# Patient Record
Sex: Male | Born: 1970 | Race: Black or African American | Hispanic: No | Smoking: Former smoker
Health system: Southern US, Community
[De-identification: ages and names within clinical notes are randomized; demographics above are authoritative.]

## PROBLEM LIST (undated history)

## (undated) DIAGNOSIS — I10 Essential (primary) hypertension: Secondary | ICD-10-CM

## (undated) DIAGNOSIS — M48062 Spinal stenosis, lumbar region with neurogenic claudication: Secondary | ICD-10-CM

## (undated) DIAGNOSIS — M1990 Unspecified osteoarthritis, unspecified site: Secondary | ICD-10-CM

## (undated) DIAGNOSIS — F329 Major depressive disorder, single episode, unspecified: Secondary | ICD-10-CM

## (undated) DIAGNOSIS — F32A Depression, unspecified: Secondary | ICD-10-CM

## (undated) DIAGNOSIS — F419 Anxiety disorder, unspecified: Secondary | ICD-10-CM

## (undated) HISTORY — DX: Anxiety disorder, unspecified: F41.9

## (undated) HISTORY — DX: Major depressive disorder, single episode, unspecified: F32.9

## (undated) HISTORY — DX: Depression, unspecified: F32.A

## (undated) HISTORY — DX: Unspecified osteoarthritis, unspecified site: M19.90

## (undated) HISTORY — DX: Essential (primary) hypertension: I10

---

## 2014-07-09 ENCOUNTER — Telehealth: Payer: Self-pay | Admitting: Family Medicine

## 2014-07-09 NOTE — Telephone Encounter (Signed)
Patient was transferred to Billing to discuss the cost of the visit and then appt will be scheduled.

## 2014-07-13 ENCOUNTER — Telehealth: Payer: Self-pay | Admitting: Family Medicine

## 2014-07-13 NOTE — Telephone Encounter (Signed)
appt given for Friday sept 11 with christy

## 2014-07-23 ENCOUNTER — Ambulatory Visit: Payer: Self-pay | Admitting: Family

## 2014-08-13 ENCOUNTER — Ambulatory Visit: Payer: Self-pay | Admitting: Family

## 2015-04-13 ENCOUNTER — Telehealth: Payer: Self-pay | Admitting: Family

## 2015-04-13 NOTE — Telephone Encounter (Signed)
Stp's mother and advised we don't do cortisone shots in the back and that pt is not established here as he has no-showed twice. He would need to schedule a new pt appt to establish care and then the provider could do a referral for him if they feel it is needed once he is evaluated. Pt's mother voiced understanding and states she will talk to pt and CB if he would like to schedule a new pt appt.

## 2015-11-13 HISTORY — PX: SPINE SURGERY: SHX786

## 2015-11-13 HISTORY — PX: CERVICAL SPINE SURGERY: SHX589

## 2017-09-10 DIAGNOSIS — M7741 Metatarsalgia, right foot: Secondary | ICD-10-CM | POA: Diagnosis not present

## 2017-09-10 DIAGNOSIS — L859 Epidermal thickening, unspecified: Secondary | ICD-10-CM | POA: Diagnosis not present

## 2017-12-05 DIAGNOSIS — I517 Cardiomegaly: Secondary | ICD-10-CM | POA: Diagnosis not present

## 2018-05-20 DIAGNOSIS — M4712 Other spondylosis with myelopathy, cervical region: Secondary | ICD-10-CM | POA: Diagnosis not present

## 2018-05-20 DIAGNOSIS — G8929 Other chronic pain: Secondary | ICD-10-CM | POA: Diagnosis not present

## 2018-05-20 DIAGNOSIS — I10 Essential (primary) hypertension: Secondary | ICD-10-CM | POA: Diagnosis not present

## 2018-05-20 DIAGNOSIS — M545 Low back pain: Secondary | ICD-10-CM | POA: Diagnosis not present

## 2018-07-07 ENCOUNTER — Ambulatory Visit: Payer: Medicaid Other | Admitting: Family Medicine

## 2018-07-07 ENCOUNTER — Encounter: Payer: Self-pay | Admitting: Family Medicine

## 2018-07-07 VITALS — BP 134/85 | Temp 97.5°F | Ht 76.0 in | Wt 222.1 lb

## 2018-07-07 DIAGNOSIS — M5416 Radiculopathy, lumbar region: Secondary | ICD-10-CM | POA: Diagnosis not present

## 2018-07-07 DIAGNOSIS — Z0000 Encounter for general adult medical examination without abnormal findings: Secondary | ICD-10-CM | POA: Diagnosis not present

## 2018-07-07 DIAGNOSIS — Z0001 Encounter for general adult medical examination with abnormal findings: Secondary | ICD-10-CM | POA: Diagnosis not present

## 2018-07-07 MED ORDER — LISINOPRIL-HYDROCHLOROTHIAZIDE 10-12.5 MG PO TABS: 1.0000 | ORAL_TABLET | Freq: Every day | ORAL | 5 refills | Status: DC

## 2018-07-07 MED ORDER — GABAPENTIN 300 MG PO CAPS: ORAL_CAPSULE | ORAL | 3 refills | Status: DC

## 2018-07-07 MED ORDER — AMITRIPTYLINE HCL 25 MG PO TABS: 25.0000 mg | ORAL_TABLET | Freq: Every day | ORAL | 5 refills | Status: DC

## 2018-07-07 NOTE — Progress Notes (Signed)
Subjective:  Patient ID: Tyrone Nelson, male    DOB: 10-19-1971  Age: 47 y.o. MRN: [id number]  CC: New Patient (Initial Visit)   HPI Jhovani Redd presents for new patient evaluation.  He has a history of neck surgery in 2017 and states neck pain is been better but subsequently he has developed lower back arthritis pain that radiates into the right hip and right lateral thigh.  This causes the right lower extremity to be numb and weak.  It throws him off balance.  He uses a quad cane to prevent falls.  He has been told by neurosurgery that his lower back is not yet surgical.  However he is having radicular pain from the lower back into the right lateral hip and thigh.  Limiting use of the right lower extremity as well as ambulation.  Pain runs 6/10 on average day occasionally higher.  Takes amitriptyline with no significant relief.  It does help him sleep a little better.  He tried gabapentin in the past but does not think it is pushed to an optimum dose after discussion.  Continue to try to be him to get some pain relief last MRI of his lower back is remote.  He would like to see a neurosurgeon for his back. He also has a problem with high blood pressure.  He takes the lisinopril HCTZ daily denies any side effects from that.  Depression screen PHQ 2/9 07/07/2018  Decreased Interest 2  Down, Depressed, Hopeless 3  PHQ - 2 Score 5  Altered sleeping 2  Tired, decreased energy 2  Change in appetite 2  Feeling bad or failure about yourself  2  Trouble concentrating 2  Moving slowly or fidgety/restless 0  Suicidal thoughts 1  PHQ-9 Score 16    History Arek has a past medical history of Arthritis, Depression, and Hypertension.   He has a past surgical history that includes Spine surgery (2017).   His family history includes Cancer in his brother; Hypertension in his mother.He reports that he quit smoking about 3 months ago. His smoking use included cigarettes. He has a 5.00 pack-year  smoking history. He has never used smokeless tobacco. He reports that he drank alcohol. He reports that he does not use drugs.    ROS Review of Systems  Constitutional: Negative.   HENT: Negative.   Eyes: Negative for visual disturbance.  Respiratory: Negative for cough and shortness of breath.   Cardiovascular: Negative for chest pain and leg swelling.  Gastrointestinal: Negative for abdominal pain, diarrhea, nausea and vomiting.  Genitourinary: Negative for difficulty urinating.  Musculoskeletal: Positive for arthralgias, back pain and gait problem. Negative for myalgias.  Skin: Negative for rash.  Neurological: Positive for weakness and numbness. Negative for headaches.  Psychiatric/Behavioral: Negative for sleep disturbance.    Objective:  BP 134/85   Temp (!) 97.5 F (36.4 C) (Oral)   Ht '6\' 4"'  (1.93 m)   Wt 222 lb 2 oz (100.8 kg)   BMI 27.04 kg/m   BP Readings from Last 3 Encounters:  07/07/18 134/85    Wt Readings from Last 3 Encounters:  07/07/18 222 lb 2 oz (100.8 kg)     Physical Exam  Constitutional: He is oriented to person, place, and time. He appears well-developed and well-nourished. He appears distressed (Due to back pain).  HENT:  Head: Normocephalic and atraumatic.  Right Ear: External ear normal.  Left Ear: External ear normal.  Nose: Nose normal.  Mouth/Throat: Oropharynx is clear and moist.  Eyes: Pupils are equal, round, and reactive to light. Conjunctivae and EOM are normal.  Neck: Normal range of motion. Neck supple.  Cardiovascular: Normal rate, regular rhythm and normal heart sounds.  No murmur heard. Pulmonary/Chest: Effort normal and breath sounds normal. No respiratory distress. He has no wheezes. He has no rales.  Abdominal: Soft. Bowel sounds are normal. He exhibits no mass. There is no tenderness. There is no rebound and no guarding.  Musculoskeletal: He exhibits tenderness (At the lumbar paraspinous musculature's approximately L3-L5  to the right of midline and radiating to the right lateral thigh). He exhibits no edema.  Neurological: He is alert and oriented to person, place, and time. He has normal reflexes.  Skin: Skin is warm and dry.  Psychiatric: His behavior is normal. Judgment and thought content normal. His affect is blunt. Cognition and memory are normal. He exhibits a depressed mood.      Assessment & Plan:   [place] was seen today for new patient (initial visit).  Diagnoses and all orders for this visit:  Well adult exam -     CBC with Differential/Platelet -     CMP14+EGFR -     Lipid panel -     PSA, total and free -     Urinalysis  Lumbar radiculopathy -     MR Lumbar Spine Wo Contrast; Future  Other orders -     gabapentin (NEURONTIN) 300 MG capsule; Take 1 QHS x 3days, then 2 PO x3days, then 3 PO x3days, then 4 PO daily. -     amitriptyline (ELAVIL) 25 MG tablet; Take 1 tablet (25 mg total) by mouth at bedtime. -     lisinopril-hydrochlorothiazide (PRINZIDE,ZESTORETIC) 10-12.5 MG tablet; Take 1 tablet by mouth daily.       I have changed Doren Custard Neeb's amitriptyline and lisinopril-hydrochlorothiazide. I am also having him start on gabapentin.  Allergies as of 07/07/2018   No Known Allergies     Medication List        Accurate as of 07/07/18  3:17 PM. Always use your most recent med list.          amitriptyline 25 MG tablet Commonly known as:  ELAVIL Take 1 tablet (25 mg total) by mouth at bedtime.   gabapentin 300 MG capsule Commonly known as:  NEURONTIN Take 1 QHS x 3days, then 2 PO x3days, then 3 PO x3days, then 4 PO daily.   lisinopril-hydrochlorothiazide 10-12.5 MG tablet Commonly known as:  PRINZIDE,ZESTORETIC Take 1 tablet by mouth daily.        Follow-up: Return in about 2 weeks (around 07/21/2018).  Claretta Fraise, M.D.

## 2018-07-08 LAB — CMP14+EGFR
ALBUMIN: 4.4 g/dL (ref 3.5–5.5)
ALT: 27 IU/L (ref 0–44)
AST: 33 IU/L (ref 0–40)
Albumin/Globulin Ratio: 1.7 (ref 1.2–2.2)
Alkaline Phosphatase: 70 IU/L (ref 39–117)
BILIRUBIN TOTAL: 0.6 mg/dL (ref 0.0–1.2)
BUN / CREAT RATIO: 12 (ref 9–20)
BUN: 16 mg/dL (ref 6–24)
CHLORIDE: 100 mmol/L (ref 96–106)
CO2: 24 mmol/L (ref 20–29)
CREATININE: 1.29 mg/dL — AB (ref 0.76–1.27)
Calcium: 9.1 mg/dL (ref 8.7–10.2)
GFR, EST AFRICAN AMERICAN: 75 mL/min/{1.73_m2} (ref >59)
GFR, EST NON AFRICAN AMERICAN: 65 mL/min/{1.73_m2} (ref >59)
GLUCOSE: 79 mg/dL (ref 65–99)
Globulin, Total: 2.6 g/dL (ref 1.5–4.5)
Potassium: 4.5 mmol/L (ref 3.5–5.2)
Sodium: 141 mmol/L (ref 134–144)
TOTAL PROTEIN: 7 g/dL (ref 6.0–8.5)

## 2018-07-08 LAB — LIPID PANEL
CHOL/HDL RATIO: 3 ratio (ref 0.0–5.0)
Cholesterol, Total: 219 mg/dL — ABNORMAL HIGH (ref 100–199)
HDL: 74 mg/dL (ref >39)
LDL CALC: 134 mg/dL — AB (ref 0–99)
Triglycerides: 54 mg/dL (ref 0–149)
VLDL CHOLESTEROL CAL: 11 mg/dL (ref 5–40)

## 2018-07-08 LAB — CBC WITH DIFFERENTIAL/PLATELET
BASOS ABS: 0 10*3/uL (ref 0.0–0.2)
BASOS: 1 %
EOS (ABSOLUTE): 0.2 10*3/uL (ref 0.0–0.4)
Eos: 2 %
Hematocrit: 42.7 % (ref 37.5–51.0)
Hemoglobin: 14.7 g/dL (ref 13.0–17.7)
IMMATURE GRANS (ABS): 0 10*3/uL (ref 0.0–0.1)
Immature Granulocytes: 0 %
LYMPHS: 33 %
Lymphocytes Absolute: 2.1 10*3/uL (ref 0.7–3.1)
MCH: 32.5 pg (ref 26.6–33.0)
MCHC: 34.4 g/dL (ref 31.5–35.7)
MCV: 95 fL (ref 79–97)
MONOCYTES: 6 %
Monocytes Absolute: 0.4 10*3/uL (ref 0.1–0.9)
NEUTROS ABS: 3.7 10*3/uL (ref 1.4–7.0)
Neutrophils: 58 %
Platelets: 199 10*3/uL (ref 150–450)
RBC: 4.52 x10E6/uL (ref 4.14–5.80)
RDW: 13.8 % (ref 12.3–15.4)
WBC: 6.3 10*3/uL (ref 3.4–10.8)

## 2018-07-08 LAB — PSA, TOTAL AND FREE
PSA FREE: 0.14 ng/mL
PSA, Free Pct: 23.3 %
Prostate Specific Ag, Serum: 0.6 ng/mL (ref 0.0–4.0)

## 2018-07-22 ENCOUNTER — Ambulatory Visit: Payer: Medicaid Other | Admitting: Family Medicine

## 2018-07-28 ENCOUNTER — Ambulatory Visit: Payer: Medicaid Other | Admitting: Family Medicine

## 2018-07-28 ENCOUNTER — Encounter: Payer: Self-pay | Admitting: Family Medicine

## 2018-07-28 VITALS — BP 144/99 | HR 90 | Temp 98.7°F | Ht 76.0 in | Wt 227.1 lb

## 2018-07-28 DIAGNOSIS — M5416 Radiculopathy, lumbar region: Secondary | ICD-10-CM

## 2018-07-28 MED ORDER — GABAPENTIN 300 MG PO CAPS: ORAL_CAPSULE | ORAL | 3 refills | Status: DC

## 2018-07-28 NOTE — Patient Instructions (Signed)
DASH Eating Plan DASH stands for "Dietary Approaches to Stop Hypertension." The DASH eating plan is a healthy eating plan that has been shown to reduce high blood pressure (hypertension). It may also reduce your risk for type 2 diabetes, heart disease, and stroke. The DASH eating plan may also help with weight loss. What are tips for following this plan? General guidelines  Avoid eating more than 2,300 mg (milligrams) of salt (sodium) a day. If you have hypertension, you may need to reduce your sodium intake to 1,500 mg a day.  Limit alcohol intake to no more than 1 drink a day for nonpregnant women and 2 drinks a day for men. One drink equals 12 oz of beer, 5 oz of wine, or 1 oz of hard liquor.  Work with your health care provider to maintain a healthy body weight or to lose weight. Ask what an ideal weight is for you.  Get at least 30 minutes of exercise that causes your heart to beat faster (aerobic exercise) most days of the week. Activities may include walking, swimming, or biking.  Work with your health care provider or diet and nutrition specialist (dietitian) to adjust your eating plan to your individual calorie needs. Reading food labels  Check food labels for the amount of sodium per serving. Choose foods with less than 5 percent of the Daily Value of sodium. Generally, foods with less than 300 mg of sodium per serving fit into this eating plan.  To find whole grains, look for the word "whole" as the first word in the ingredient list. Shopping  Buy products labeled as "low-sodium" or "no salt added."  Buy fresh foods. Avoid canned foods and premade or frozen meals. Cooking  Avoid adding salt when cooking. Use salt-free seasonings or herbs instead of table salt or sea salt. Check with your health care provider or pharmacist before using salt substitutes.  Do not fry foods. Cook foods using healthy methods such as baking, boiling, grilling, and broiling instead.  Cook with  heart-healthy oils, such as olive, canola, soybean, or sunflower oil. Meal planning   Eat a balanced diet that includes: ? 5 or more servings of fruits and vegetables each day. At each meal, try to fill half of your plate with fruits and vegetables. ? Up to 6-8 servings of whole grains each day. ? Less than 6 oz of lean meat, poultry, or fish each day. A 3-oz serving of meat is about the same size as a deck of cards. One egg equals 1 oz. ? 2 servings of low-fat dairy each day. ? A serving of nuts, seeds, or beans 5 times each week. ? Heart-healthy fats. Healthy fats called Omega-3 fatty acids are found in foods such as flaxseeds and coldwater fish, like sardines, salmon, and mackerel.  Limit how much you eat of the following: ? Canned or prepackaged foods. ? Food that is high in trans fat, such as fried foods. ? Food that is high in saturated fat, such as fatty meat. ? Sweets, desserts, sugary drinks, and other foods with added sugar. ? Full-fat dairy products.  Do not salt foods before eating.  Try to eat at least 2 vegetarian meals each week.  Eat more home-cooked food and less restaurant, buffet, and fast food.  When eating at a restaurant, ask that your food be prepared with less salt or no salt, if possible. What foods are recommended? The items listed may not be a complete list. Talk with your dietitian about what   dietary choices are best for you. Grains Whole-grain or whole-wheat bread. Whole-grain or whole-wheat pasta. Brown rice. Oatmeal. Quinoa. Bulgur. Whole-grain and low-sodium cereals. Pita bread. Low-fat, low-sodium crackers. Whole-wheat flour tortillas. Vegetables Fresh or frozen vegetables (raw, steamed, roasted, or grilled). Low-sodium or reduced-sodium tomato and vegetable juice. Low-sodium or reduced-sodium tomato sauce and tomato paste. Low-sodium or reduced-sodium canned vegetables. Fruits All fresh, dried, or frozen fruit. Canned fruit in natural juice (without  added sugar). Meat and other protein foods Skinless chicken or turkey. Ground chicken or turkey. Pork with fat trimmed off. Fish and seafood. Egg whites. Dried beans, peas, or lentils. Unsalted nuts, nut butters, and seeds. Unsalted canned beans. Lean cuts of beef with fat trimmed off. Low-sodium, lean deli meat. Dairy Low-fat (1%) or fat-free (skim) milk. Fat-free, low-fat, or reduced-fat cheeses. Nonfat, low-sodium ricotta or cottage cheese. Low-fat or nonfat yogurt. Low-fat, low-sodium cheese. Fats and oils Soft margarine without trans fats. Vegetable oil. Low-fat, reduced-fat, or light mayonnaise and salad dressings (reduced-sodium). Canola, safflower, olive, soybean, and sunflower oils. Avocado. Seasoning and other foods Herbs. Spices. Seasoning mixes without salt. Unsalted popcorn and pretzels. Fat-free sweets. What foods are not recommended? The items listed may not be a complete list. Talk with your dietitian about what dietary choices are best for you. Grains Baked goods made with fat, such as croissants, muffins, or some breads. Dry pasta or rice meal packs. Vegetables Creamed or fried vegetables. Vegetables in a cheese sauce. Regular canned vegetables (not low-sodium or reduced-sodium). Regular canned tomato sauce and paste (not low-sodium or reduced-sodium). Regular tomato and vegetable juice (not low-sodium or reduced-sodium). Pickles. Olives. Fruits Canned fruit in a light or heavy syrup. Fried fruit. Fruit in cream or butter sauce. Meat and other protein foods Fatty cuts of meat. Ribs. Fried meat. Bacon. Sausage. Bologna and other processed lunch meats. Salami. Fatback. Hotdogs. Bratwurst. Salted nuts and seeds. Canned beans with added salt. Canned or smoked fish. Whole eggs or egg yolks. Chicken or turkey with skin. Dairy Whole or 2% milk, cream, and half-and-half. Whole or full-fat cream cheese. Whole-fat or sweetened yogurt. Full-fat cheese. Nondairy creamers. Whipped toppings.  Processed cheese and cheese spreads. Fats and oils Butter. Stick margarine. Lard. Shortening. Ghee. Bacon fat. Tropical oils, such as coconut, palm kernel, or palm oil. Seasoning and other foods Salted popcorn and pretzels. Onion salt, garlic salt, seasoned salt, table salt, and sea salt. Worcestershire sauce. Tartar sauce. Barbecue sauce. Teriyaki sauce. Soy sauce, including reduced-sodium. Steak sauce. Canned and packaged gravies. Fish sauce. Oyster sauce. Cocktail sauce. Horseradish that you find on the shelf. Ketchup. Mustard. Meat flavorings and tenderizers. Bouillon cubes. Hot sauce and Tabasco sauce. Premade or packaged marinades. Premade or packaged taco seasonings. Relishes. Regular salad dressings. Where to find more information:  National Heart, Lung, and Blood Institute: www.nhlbi.nih.gov  American Heart Association: www.heart.org Summary  The DASH eating plan is a healthy eating plan that has been shown to reduce high blood pressure (hypertension). It may also reduce your risk for type 2 diabetes, heart disease, and stroke.  With the DASH eating plan, you should limit salt (sodium) intake to 2,300 mg a day. If you have hypertension, you may need to reduce your sodium intake to 1,500 mg a day.  When on the DASH eating plan, aim to eat more fresh fruits and vegetables, whole grains, lean proteins, low-fat dairy, and heart-healthy fats.  Work with your health care provider or diet and nutrition specialist (dietitian) to adjust your eating plan to your individual   calorie needs. This information is not intended to replace advice given to you by your health care provider. Make sure you discuss any questions you have with your health care provider. Document Released: 10/18/2011 Document Revised: 10/22/2016 Document Reviewed: 10/22/2016 Elsevier Interactive Patient Education  2018 Else[employer] Inc.  

## 2018-07-28 NOTE — Progress Notes (Signed)
Chief Complaint  Patient presents with  . Follow-up    pt here today for 2 week follow up    HPI  Patient presents today for recheck of low back pain.  Patient states that he got no relief from the previous treatment.  He did note that the gabapentin made him feel somewhat more off balance than usual.  He uses a quad cane due to chronic balance issues.  This has been exacerbated by the titration of gabapentin.  PMH: Smoking status noted ROS: Per HPI  Objective: BP (!) 144/99   Pulse 90   Temp 98.7 F (37.1 C) (Oral)   Ht 6\' 4"  (1.93 m)   Wt 227 lb 2 oz (103 kg)   BMI 27.65 kg/m  Gen: NAD, alert, cooperative with exam HEENT: NCAT, EOMI, PERRL CV: RRR, good S1/S2, no murmur Resp: CTABL, no wheezes, non-labored Ext: No edema, warm.  Lumbar spine has tender musculature at the spinalis bilaterally Neuro: Alert and oriented, No gross deficits  Assessment and plan:  1. Lumbar radiculopathy     Meds ordered this encounter  Medications  . gabapentin (NEURONTIN) 300 MG capsule    Sig: Take 2 PO QAM, and 3 PO QPM x 2 weeks, then take 3 PO twice daily    Dispense:  180 capsule    Refill:  3    Orders Placed This Encounter  Procedures  . Ambulatory referral to Orthopedic Surgery    Referral Priority:   Routine    Referral Type:   Surgical    Referral Reason:   Specialty Services Required    Requested Specialty:   Orthopedic Surgery    Number of Visits Requested:   1    Follow up as needed.  Mechele ClaudeWarren Svara Twyman, MD

## 2018-09-10 ENCOUNTER — Encounter: Payer: Self-pay | Admitting: Family Medicine

## 2018-09-10 ENCOUNTER — Ambulatory Visit: Payer: Medicaid Other | Admitting: Family Medicine

## 2018-09-10 VITALS — BP 129/92 | HR 122 | Temp 98.6°F | Ht 76.0 in | Wt 238.0 lb

## 2018-09-10 DIAGNOSIS — M5416 Radiculopathy, lumbar region: Secondary | ICD-10-CM

## 2018-09-10 MED ORDER — TRAZODONE HCL 100 MG PO TABS: 100.0000 mg | ORAL_TABLET | Freq: Every day | ORAL | 2 refills | Status: DC

## 2018-09-10 MED ORDER — AMITRIPTYLINE HCL 50 MG PO TABS: 50.0000 mg | ORAL_TABLET | Freq: Every day | ORAL | 2 refills | Status: DC

## 2018-09-10 MED ORDER — GABAPENTIN 300 MG PO CAPS: 1500.0000 mg | ORAL_CAPSULE | Freq: Two times a day (BID) | ORAL | 2 refills | Status: DC

## 2018-09-10 NOTE — Progress Notes (Signed)
Subjective:  Patient ID: Tyrone Nelson, male    DOB: [birth year]  Age: 47 y.o. MRN: [id number]  CC: 6 week follow up   HPI [facility] presents for continued lower back pain.  Now running about a 8-9/10.  He went ahead and increase the gabapentin to 1200 mg twice a day as for 300 mg tablets twice daily.  Due to the severity of the pain he is taking 3-4 amitriptyline frequently in order to get some sleep even this does not really relieve the pain though.  He has not been able to get an MRI or referral since Medicaid does not have this practice down as his primary care practice.  Therefore he has not moved forward with our plan.  However, he tells me he called them a month ago.  He was assured at that time that I would be named as his PCP.  Depression screen [facility] 2/9 09/10/2018 07/28/2018 07/07/2018  Decreased Interest 0 2 2  Down, Depressed, Hopeless 0 3 3  PHQ - 2 Score 0 5 5  Altered sleeping - 2 2  Tired, decreased energy - 2 2  Change in appetite - 2 2  Feeling bad or failure about yourself  - 2 2  Trouble concentrating - 2 2  Moving slowly or fidgety/restless - 0 0  Suicidal thoughts - 1 1  PHQ-9 Score - 16 16    History [place] has a past medical history of Arthritis, Depression, and Hypertension.   He has a past surgical history that includes Spine surgery (2017).   His family history includes Cancer in his brother; Hypertension in his mother.He reports that he quit smoking about 5 months ago. His smoking use included cigarettes. He has a 5.00 pack-year smoking history. He has never used smokeless tobacco. He reports that he drank alcohol. He reports that he does not use drugs.    ROS Review of Systems  Constitutional: Negative for fever.  Respiratory: Negative for shortness of breath.   Cardiovascular: Negative for chest pain.  Musculoskeletal: Positive for arthralgias and back pain.  Skin: Negative for rash.    Objective:  BP (!) 129/92   Pulse (!) 122   Temp  98.6 F (37 C) (Oral)   Ht 6\' 4"  (1.93 m)   Wt 238 lb (108 kg)   BMI 28.97 kg/m   BP Readings from Last 3 Encounters:  09/10/18 (!) 129/92  07/28/18 (!) 144/99  07/07/18 134/85    Wt Readings from Last 3 Encounters:  09/10/18 238 lb (108 kg)  07/28/18 227 lb 2 oz (103 kg)  07/07/18 222 lb 2 oz (100.8 kg)     Physical Exam  Constitutional: He is oriented to person, place, and time. He appears well-developed and well-nourished.  HENT:  Mouth/Throat: No oropharyngeal exudate or posterior oropharyngeal erythema.  Eyes: Pupils are equal, round, and reactive to light.  Neck: Normal range of motion. Neck supple.  Cardiovascular: Normal rate and regular rhythm.  No murmur heard. Pulmonary/Chest: Breath sounds normal. No respiratory distress.  Musculoskeletal: He exhibits tenderness (lower back).  Neurological: He is alert and oriented to person, place, and time. Coordination (Poor coordination for gait.  Using a quad cane) abnormal.  Vitals reviewed.     Assessment & Plan:   [place] was seen today for 6 week follow up.  Diagnoses and all orders for this visit:  Lumbar radiculopathy -     MR Lumbar Spine Wo Contrast; Future -     Ambulatory referral  to Orthopedics  Other orders -     amitriptyline (ELAVIL) 50 MG tablet; Take 1 tablet (50 mg total) by mouth at bedtime. Cancel refills for the 25 mg strength -     gabapentin (NEURONTIN) 300 MG capsule; Take 5 capsules (1,500 mg total) by mouth 2 (two) times daily. -     traZODone (DESYREL) 100 MG tablet; Take 1 tablet (100 mg total) by mouth at bedtime. For sleep       I have changed Aneta Mins Taha's amitriptyline and gabapentin. I am also having him start on traZODone. Additionally, I am having him maintain his lisinopril-hydrochlorothiazide.  Allergies as of 09/10/2018   No Known Allergies     Medication List        Accurate as of 09/10/18  6:28 PM. Always use your most recent med list.            amitriptyline 50 MG tablet Commonly known as:  ELAVIL Take 1 tablet (50 mg total) by mouth at bedtime. Cancel refills for the 25 mg strength   gabapentin 300 MG capsule Commonly known as:  NEURONTIN Take 5 capsules (1,500 mg total) by mouth 2 (two) times daily.   lisinopril-hydrochlorothiazide 10-12.5 MG tablet Commonly known as:  PRINZIDE,ZESTORETIC Take 1 tablet by mouth daily.   traZODone 100 MG tablet Commonly known as:  DESYREL Take 1 tablet (100 mg total) by mouth at bedtime. For sleep        Follow-up: Return in about 6 weeks (around 10/22/2018).  Mechele Claude, M.D.

## 2018-09-26 ENCOUNTER — Ambulatory Visit (INDEPENDENT_AMBULATORY_CARE_PROVIDER_SITE_OTHER): Payer: Medicaid Other | Admitting: Orthopaedic Surgery

## 2018-10-17 ENCOUNTER — Ambulatory Visit (INDEPENDENT_AMBULATORY_CARE_PROVIDER_SITE_OTHER): Payer: Medicaid Other | Admitting: Orthopaedic Surgery

## 2018-11-18 ENCOUNTER — Ambulatory Visit (INDEPENDENT_AMBULATORY_CARE_PROVIDER_SITE_OTHER): Payer: Medicaid Other | Admitting: Orthopaedic Surgery

## 2019-01-01 ENCOUNTER — Ambulatory Visit: Payer: Medicaid Other | Admitting: Family Medicine

## 2019-01-06 ENCOUNTER — Ambulatory Visit: Payer: Medicaid Other | Admitting: Family Medicine

## 2019-01-16 ENCOUNTER — Ambulatory Visit: Payer: Medicaid Other | Admitting: Family Medicine

## 2019-01-16 ENCOUNTER — Encounter: Payer: Self-pay | Admitting: Family Medicine

## 2019-01-16 VITALS — BP 91/65 | HR 99 | Temp 97.9°F | Ht 76.0 in | Wt 215.1 lb

## 2019-01-16 DIAGNOSIS — F411 Generalized anxiety disorder: Secondary | ICD-10-CM

## 2019-01-16 DIAGNOSIS — M5416 Radiculopathy, lumbar region: Secondary | ICD-10-CM | POA: Diagnosis not present

## 2019-01-16 DIAGNOSIS — F5105 Insomnia due to other mental disorder: Secondary | ICD-10-CM | POA: Diagnosis not present

## 2019-01-16 DIAGNOSIS — F322 Major depressive disorder, single episode, severe without psychotic features: Secondary | ICD-10-CM | POA: Diagnosis not present

## 2019-01-16 DIAGNOSIS — F99 Mental disorder, not otherwise specified: Secondary | ICD-10-CM | POA: Diagnosis not present

## 2019-01-16 DIAGNOSIS — R002 Palpitations: Secondary | ICD-10-CM

## 2019-01-16 MED ORDER — DULOXETINE HCL 60 MG PO CPEP: 60.0000 mg | ORAL_CAPSULE | Freq: Every day | ORAL | 2 refills | Status: DC

## 2019-01-16 MED ORDER — TRAZODONE HCL 300 MG PO TABS: 300.0000 mg | ORAL_TABLET | Freq: Every evening | ORAL | 2 refills | Status: DC | PRN

## 2019-01-16 NOTE — Progress Notes (Signed)
Subjective:  Patient ID: Tyrone Nelson, male    DOB: [birth year]  Age: 48 y.o. MRN: [id number]  CC: Medical Management of Chronic Issues (pt here today for routine follow up and also wants to discuss anxiety and depression)   HPI Tyrone Nelson presents for recheck of his chronic lumbar radiculopathy.  Pain persists at a significant level.  It limits his activities to a minimum.  He tried to do some work for his brother recently that involved only minimal exertion.  He was unable to do this successfully.  Otherwise he has no income but has forms to last him about for 5 months.  His wallet lawyer tells me that he extremely difficult to get approval for disability under age 89.  He is very upset depressed and anxious about this.  He tells me that he has been worrying constantly he is restless and cannot relax.  He has a PHQ score below that is elevated as well as a GAD-7 score that is maxed.  Additionally he says he sleeps well about half the nights in spite of the medications he is currently taking.  He reports random palpitations.  These may be during a period of anxiety or sometimes they are not.  They are not associated with chest pain or shortness of breath or swelling.   Depression screen [facility] 2/9 01/16/2019 09/10/2018 07/28/2018  Decreased Interest 2 0 2  Down, Depressed, Hopeless 2 0 3  PHQ - 2 Score 4 0 5  Altered sleeping 2 - 2  Tired, decreased energy 2 - 2  Change in appetite 2 - 2  Feeling bad or failure about yourself  2 - 2  Trouble concentrating 2 - 2  Moving slowly or fidgety/restless 2 - 0  Suicidal thoughts 0 - 1  PHQ-9 Score 16 - 16  Difficult doing work/chores Somewhat difficult - -    History Tyrone Nelson has a past medical history of Arthritis, Depression, and Hypertension.   He has a past surgical history that includes Spine surgery (2017).   His family history includes Cancer in his brother; Hypertension in his mother.He reports that he quit smoking about 9 months  ago. His smoking use included cigarettes. He has a 5.00 pack-year smoking history. He has never used smokeless tobacco. He reports previous alcohol use. He reports that he does not use drugs.    ROS Review of Systems  Constitutional: Negative.   HENT: Negative.   Eyes: Negative for visual disturbance.  Respiratory: Negative for cough and shortness of breath.   Cardiovascular: Positive for palpitations. Negative for chest pain and leg swelling.  Gastrointestinal: Negative for abdominal pain, diarrhea, nausea and vomiting.  Genitourinary: Negative for difficulty urinating.  Musculoskeletal: Positive for arthralgias, back pain, myalgias and neck pain.  Skin: Negative for rash.  Neurological: Negative for headaches.  Psychiatric/Behavioral: Positive for agitation, decreased concentration, dysphoric mood and sleep disturbance. The patient is nervous/anxious.     Objective:  BP 91/65   Pulse 99   Temp 97.9 F (36.6 C) (Oral)   Ht _0  (1.93 m)   Wt 215 lb 2 oz (97.6 kg)   BMI 26.19 kg/m   BP Readings from Last 3 Encounters:  01/16/19 91/65  09/10/18 (!) 129/92  07/28/18 (!) 144/99    Wt Readings from Last 3 Encounters:  01/16/19 215 lb 2 oz (97.6 kg)  09/10/18 238 lb (108 kg)  07/28/18 227 lb 2 oz (103 kg)     Physical Exam Constitutional:  General: He is not in acute distress.    Appearance: He is well-developed.  HENT:     Head: Normocephalic and atraumatic.     Right Ear: External ear normal.     Left Ear: External ear normal.     Nose: Nose normal.  Eyes:     Conjunctiva/sclera: Conjunctivae normal.     Pupils: Pupils are equal, round, and reactive to light.  Neck:     Musculoskeletal: Normal range of motion and neck supple.  Cardiovascular:     Rate and Rhythm: Normal rate and regular rhythm.     Heart sounds: Normal heart sounds. No murmur.  Pulmonary:     Effort: Pulmonary effort is normal. No respiratory distress.     Breath sounds: Normal breath  sounds. No wheezing or rales.  Abdominal:     Palpations: Abdomen is soft.     Tenderness: There is no abdominal tenderness.  Musculoskeletal:        General: Tenderness (Over lumbar spine for percussion and palpation of lumbar paraspinous musculature.) present.  Skin:    General: Skin is warm and dry.  Neurological:     Mental Status: He is alert and oriented to person, place, and time.     Deep Tendon Reflexes: Reflexes are normal and symmetric.  Psychiatric:        Behavior: Behavior normal.        Thought Content: Thought content normal.        Judgment: Judgment normal.       Assessment & Plan:   Tyrone Nelson was seen today for medical management of chronic issues.  Diagnoses and all orders for this visit:  Current severe episode of major depressive disorder without psychotic features without prior episode (HCC)  Palpitations -     CBC with Differential/Platelet -     CMP14+EGFR -     TSH -     Ambulatory referral to Cardiology  Lumbar radiculopathy  GAD (generalized anxiety disorder)  Insomnia due to other mental disorder  Other orders -     traZODone (DESYREL) 300 MG tablet; Take 1 tablet (300 mg total) by mouth at bedtime as needed for sleep. -     DULoxetine (CYMBALTA) 60 MG capsule; Take 1 capsule (60 mg total) by mouth daily.       I have discontinued Tyrone Nelson's traZODone. I am also having him start on trazodone and DULoxetine. Additionally, I am having him maintain his lisinopril-hydrochlorothiazide, amitriptyline, and gabapentin.  Allergies as of 01/16/2019   No Known Allergies     Medication List       Accurate as of January 16, 2019 11:59 AM. Always use your most recent med list.        amitriptyline 50 MG tablet Commonly known as:  ELAVIL Take 1 tablet (50 mg total) by mouth at bedtime. Cancel refills for the 25 mg strength   DULoxetine 60 MG capsule Commonly known as:  Cymbalta Take 1 capsule (60 mg total) by mouth daily.   gabapentin  300 MG capsule Commonly known as:  NEURONTIN Take 5 capsules (1,500 mg total) by mouth 2 (two) times daily.   lisinopril-hydrochlorothiazide 10-12.5 MG tablet Commonly known as:  PRINZIDE,ZESTORETIC Take 1 tablet by mouth daily.   trazodone 300 MG tablet Commonly known as:  DESYREL Take 1 tablet (300 mg total) by mouth at bedtime as needed for sleep.        Follow-up: No follow-ups on file.  Tyrone Nelson, M.D.

## 2019-01-17 LAB — CBC WITH DIFFERENTIAL/PLATELET
BASOS: 1 %
Basophils Absolute: 0 10*3/uL (ref 0.0–0.2)
EOS (ABSOLUTE): 0.1 10*3/uL (ref 0.0–0.4)
Eos: 2 %
Hematocrit: 42.9 % (ref 37.5–51.0)
Hemoglobin: 14.6 g/dL (ref 13.0–17.7)
IMMATURE GRANS (ABS): 0 10*3/uL (ref 0.0–0.1)
IMMATURE GRANULOCYTES: 0 %
Lymphocytes Absolute: 2.1 10*3/uL (ref 0.7–3.1)
Lymphs: 37 %
MCH: 32.3 pg (ref 26.6–33.0)
MCHC: 34 g/dL (ref 31.5–35.7)
MCV: 95 fL (ref 79–97)
Monocytes Absolute: 0.6 10*3/uL (ref 0.1–0.9)
Monocytes: 10 %
NEUTROS PCT: 50 %
Neutrophils Absolute: 2.9 10*3/uL (ref 1.4–7.0)
PLATELETS: 244 10*3/uL (ref 150–450)
RBC: 4.52 x10E6/uL (ref 4.14–5.80)
RDW: 12.2 % (ref 11.6–15.4)
WBC: 5.7 10*3/uL (ref 3.4–10.8)

## 2019-01-17 LAB — CMP14+EGFR
ALT: 27 IU/L (ref 0–44)
AST: 32 IU/L (ref 0–40)
Albumin/Globulin Ratio: 1.9 (ref 1.2–2.2)
Albumin: 4.8 g/dL (ref 4.0–5.0)
Alkaline Phosphatase: 73 IU/L (ref 39–117)
BUN/Creatinine Ratio: 12 (ref 9–20)
BUN: 27 mg/dL — AB (ref 6–24)
Bilirubin Total: 0.7 mg/dL (ref 0.0–1.2)
CO2: 21 mmol/L (ref 20–29)
CREATININE: 2.34 mg/dL — AB (ref 0.76–1.27)
Calcium: 9.8 mg/dL (ref 8.7–10.2)
Chloride: 90 mmol/L — ABNORMAL LOW (ref 96–106)
GFR calc Af Amer: 36 mL/min/{1.73_m2} — ABNORMAL LOW (ref >59)
GFR, EST NON AFRICAN AMERICAN: 31 mL/min/{1.73_m2} — AB (ref >59)
GLUCOSE: 86 mg/dL (ref 65–99)
Globulin, Total: 2.5 g/dL (ref 1.5–4.5)
POTASSIUM: 4.4 mmol/L (ref 3.5–5.2)
Sodium: 129 mmol/L — ABNORMAL LOW (ref 134–144)
TOTAL PROTEIN: 7.3 g/dL (ref 6.0–8.5)

## 2019-01-17 LAB — TSH: TSH: 1.42 u[IU]/mL (ref 0.450–4.500)

## 2019-01-19 ENCOUNTER — Other Ambulatory Visit: Payer: Self-pay

## 2019-01-19 DIAGNOSIS — R7989 Other specified abnormal findings of blood chemistry: Secondary | ICD-10-CM

## 2019-01-26 ENCOUNTER — Ambulatory Visit: Payer: Self-pay | Admitting: Cardiology

## 2019-01-26 ENCOUNTER — Other Ambulatory Visit: Payer: Self-pay

## 2019-01-26 ENCOUNTER — Other Ambulatory Visit: Payer: Medicaid Other

## 2019-01-26 DIAGNOSIS — R7989 Other specified abnormal findings of blood chemistry: Secondary | ICD-10-CM | POA: Diagnosis not present

## 2019-01-26 LAB — CMP14+EGFR
ALT: 22 IU/L (ref 0–44)
AST: 22 IU/L (ref 0–40)
Albumin/Globulin Ratio: 2 (ref 1.2–2.2)
Albumin: 4.3 g/dL (ref 4.0–5.0)
Alkaline Phosphatase: 65 IU/L (ref 39–117)
BUN/Creatinine Ratio: 8 — ABNORMAL LOW (ref 9–20)
BUN: 10 mg/dL (ref 6–24)
Bilirubin Total: 0.8 mg/dL (ref 0.0–1.2)
CO2: 20 mmol/L (ref 20–29)
Calcium: 9.5 mg/dL (ref 8.7–10.2)
Chloride: 98 mmol/L (ref 96–106)
Creatinine, Ser: 1.29 mg/dL — ABNORMAL HIGH (ref 0.76–1.27)
GFR calc Af Amer: 75 mL/min/{1.73_m2} (ref >59)
GFR calc non Af Amer: 65 mL/min/{1.73_m2} (ref >59)
Globulin, Total: 2.1 g/dL (ref 1.5–4.5)
Glucose: 82 mg/dL (ref 65–99)
Potassium: 4.5 mmol/L (ref 3.5–5.2)
Sodium: 137 mmol/L (ref 134–144)
Total Protein: 6.4 g/dL (ref 6.0–8.5)

## 2019-01-26 NOTE — Progress Notes (Deleted)
     Clinical Summary Tyrone Nelson is a 48 y.o.male seen as new patient   1. Palpitations   - pcp ordered labs    Past Medical History:  Diagnosis Date  . Arthritis   . Depression   . Hypertension      No Known Allergies   Current Outpatient Medications  Medication Sig Dispense Refill  . amitriptyline (ELAVIL) 50 MG tablet Take 1 tablet (50 mg total) by mouth at bedtime. Cancel refills for the 25 mg strength 30 tablet 2  . DULoxetine (CYMBALTA) 60 MG capsule Take 1 capsule (60 mg total) by mouth daily. 30 capsule 2  . gabapentin (NEURONTIN) 300 MG capsule Take 5 capsules (1,500 mg total) by mouth 2 (two) times daily. 300 capsule 2  . lisinopril-hydrochlorothiazide (PRINZIDE,ZESTORETIC) 10-12.5 MG tablet Take 1 tablet by mouth daily. 30 tablet 5  . traZODone (DESYREL) 300 MG tablet Take 1 tablet (300 mg total) by mouth at bedtime as needed for sleep. 30 tablet 2   No current facility-administered medications for this visit.      Past Surgical History:  Procedure Laterality Date  . 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 Hypertension Mother   . Cancer Brother      Social History Tyrone Nelson reports that he quit smoking about 9 months ago. His smoking use included cigarettes. He has a 5.00 pack-year smoking history. He has never used smokeless tobacco. Tyrone Nelson reports previous alcohol use.   Review of Systems CONSTITUTIONAL: No weight loss, fever, chills, weakness or fatigue.  HEENT: Eyes: No visual loss, blurred vision, double vision or yellow sclerae.No hearing loss, sneezing, congestion, runny nose or sore throat.  SKIN: No rash or itching.  CARDIOVASCULAR:  RESPIRATORY: No shortness of breath, cough or sputum.  GASTROINTESTINAL: No anorexia, nausea, vomiting or diarrhea. No abdominal pain or blood.  GENITOURINARY: No burning on urination, no polyuria  NEUROLOGICAL: No headache, dizziness, syncope, paralysis, ataxia, numbness or tingling in the extremities. No change in bowel or bladder control.  MUSCULOSKELETAL: No muscle, back pain, joint pain or stiffness.  LYMPHATICS: No enlarged nodes. No history of splenectomy.  PSYCHIATRIC: No history of depression or anxiety.  ENDOCRINOLOGIC: No reports of sweating, cold or heat intolerance. No polyuria or polydipsia.  Marland Kitchen   Physical Examination There were no vitals filed for this visit. There were no vitals filed for this visit.  Gen: resting comfortably, no acute distress HEENT: no scleral icterus, pupils equal round and reactive, no palptable cervical adenopathy,  CV Resp: Clear to auscultation bilaterally GI: abdomen is soft, non-tender, non-distended, normal bowel sounds, no hepatosplenomegaly MSK: extremities are warm, no edema.  Skin: warm, no rash Neuro:  no focal deficits Psych: appropriate affect   Diagnostic Studies     Assessment and Plan        Tyrone Nelson, M.D., F.A.C.C.

## 2019-01-27 ENCOUNTER — Other Ambulatory Visit: Payer: Self-pay | Admitting: Family Medicine

## 2019-01-27 MED ORDER — DILTIAZEM HCL ER COATED BEADS 240 MG PO CP24: 240.0000 mg | ORAL_CAPSULE | Freq: Every day | ORAL | 1 refills | Status: DC

## 2019-02-02 ENCOUNTER — Telehealth (INDEPENDENT_AMBULATORY_CARE_PROVIDER_SITE_OTHER): Payer: Medicaid Other | Admitting: Family Medicine

## 2019-02-02 ENCOUNTER — Other Ambulatory Visit: Payer: Self-pay

## 2019-02-02 DIAGNOSIS — N410 Acute prostatitis: Secondary | ICD-10-CM

## 2019-02-02 MED ORDER — CIPROFLOXACIN HCL 500 MG PO TABS: 500.0000 mg | ORAL_TABLET | Freq: Two times a day (BID) | ORAL | 0 refills | Status: DC

## 2019-02-02 NOTE — Progress Notes (Signed)
Virtual Visit via Telephone Note  I connected with Tyrone Nelson on 02/02/19 at 1:35 by telephone and verified that I am speaking with the correct person using two identifiers.   I discussed the limitations, risks, security and privacy concerns of performing an evaluation and management service by telephone and the availability of in person appointments. I also discussed with the patient that there may be a patient responsible charge related to this service. The patient expressed understanding and agreed to proceed.   History of Present Illness:   Onset last night had painful urination. Not much urine there. Then frequent several times. This morning was normal. A little pain with normal flow since then. Denies fever. No chills or sweats. Denies nausea & flank pain. No hesitancy. Flow normal.    Observations/Objective: No distress by phone demeanor.   Assessment and Plan: 1. Acute prostatitis     Meds ordered this encounter  Medications  . ciprofloxacin (CIPRO) 500 MG tablet    Sig: Take 1 tablet (500 mg total) by mouth 2 (two) times daily. For prostate. Take all of these.    Dispense:  30 tablet    Refill:  0     Follow Up Instructions: Drink extra water, at least 64 oz a day.  Cranberry juice   I discussed the assessment and treatment plan with the patient. The patient was provided an opportunity to ask questions and all were answered. The patient agreed with the plan and demonstrated an understanding of the instructions.   The patient was advised to call back or seek an in-person evaluation if the symptoms worsen or if the condition fails to improve as anticipated.  I provided 7 minutes of non-face-to-face time during this encounter.   Mechele Claude, MD

## 2019-02-04 ENCOUNTER — Other Ambulatory Visit: Payer: Self-pay | Admitting: *Deleted

## 2019-02-04 MED ORDER — CIPROFLOXACIN HCL 500 MG PO TABS: 500.0000 mg | ORAL_TABLET | Freq: Two times a day (BID) | ORAL | 0 refills | Status: DC

## 2019-02-10 ENCOUNTER — Telehealth: Payer: Self-pay | Admitting: Cardiology

## 2019-02-10 NOTE — Telephone Encounter (Signed)
° ° °  Left patient messageto call to ask questions and pre-register for telephone appointment February 11, 2019 with Dr Antoine Poche.  COVID-19 Pre-Screening Questions:   Do you currently have a fever?       Have you recently travelled on a cruise, internationally, or to [place], [place], [place], [place], [place], or [place], [place] [employer]) ?       Have you been in contact with someone that is currently pending confirmation of Covid19 testing or has been confirmed to have the Covid19 virus?       Are you currently experiencing fatigue or cough?

## 2019-02-11 ENCOUNTER — Encounter: Payer: Self-pay | Admitting: Cardiology

## 2019-02-11 ENCOUNTER — Other Ambulatory Visit: Payer: Self-pay

## 2019-02-11 ENCOUNTER — Telehealth (INDEPENDENT_AMBULATORY_CARE_PROVIDER_SITE_OTHER): Payer: Medicaid Other | Admitting: Cardiology

## 2019-02-11 VITALS — BP 120/83 | HR 79 | Ht 76.0 in | Wt 220.0 lb

## 2019-02-11 DIAGNOSIS — R002 Palpitations: Secondary | ICD-10-CM | POA: Diagnosis not present

## 2019-02-11 NOTE — Progress Notes (Signed)
Virtual Visit via Telephone Note    Evaluation Performed:  Follow-up visit  This visit type was conducted due to national recommendations for restrictions regarding the COVID-19 Pandemic (e.g. social distancing).  This format is felt to be most appropriate for this patient at this time.  All issues noted in this document were discussed and addressed.  No physical exam was performed (except for noted visual exam findings with Video Visits).  Please refer to the patient's chart (MyChart message for video visits and phone note for telephone visits) for the patient's consent to telehealth for [facility].  Date:  02/11/2019   ID:  Tyrone Nelson, DOB 1971-09-19, MRN [id number]  Patient Location:  Home address  Provider location:   [place], [place]  PCP:  Mechele Claude, MD  Cardiologist:  Dr. Antoine Poche Electrophysiologist:  None   Chief Complaint:  Palpitations  History of Present Illness:    Tyrone Nelson is a 47 y.o. male who presents via audio/video conferencing for a telehealth visit today.    The patient has no past cardiac history.  He has a history of depression and also describes some symptoms consistent with anxiety.  He has not had any prior cardiac history.  He has not had any prior cardiac testing.  He is very limited in his activities and he walks with a walking stick after having had neck surgery.  He says he has leg weakness.  He says that his palpitations come on when he is anxious.  He does not notice them in particular at anytime of the day or night.  He is not having any new chest pressure, neck or arm discomfort.  He has no shortness of breath, PND or orthopnea.  He says the palpitations feel like a rapid heartbeat but he does not take his pulse.  He does not describe it is irregular.  He does not have any presyncope or syncope with this.  It goes away by itself.  He was just started on something for depression and anxiety.  He is not sure yet whether this is helped.   The patient does not have symptoms concerning for COVID-19 infection (fever, chills, cough, or new shortness of breath).    Prior CV studies:   The following studies were reviewed today:  Labs  Past Medical History:  Diagnosis Date  . Arthritis   . Depression   . Hypertension    Past Surgical History:  Procedure Laterality Date  . 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 ciprofloxacin (CIPRO) 500 MG tablet Take 1 tablet (500 mg total) by mouth 2 (two) times daily. For prostate. Take all of these.  . diltiazem (CARDIZEM CD) 240 MG 24 hr capsule Take 1 capsule (240 mg total) by mouth daily. For blood pressure  . DULoxetine (CYMBALTA) 60 MG capsule Take 1 capsule (60 mg total) by mouth daily.  Marland Kitchen gabapentin (NEURONTIN) 300 MG capsule Take 5 capsules (1,500 mg total) by mouth 2 (two) times daily.  . traZODone (DESYREL) 300 MG tablet Take 1 tablet (300 mg total) by mouth at bedtime as needed for sleep.     Allergies:   Patient has no known allergies.   Social History   Tobacco Use  . Smoking status: Current Every Day Smoker    Packs/day: 0.50    Years: 10.00    Pack years: 5.00    Types: Cigarettes    Last attempt  to quit: 04/06/2018    Years since quitting: 0.8  . Smokeless tobacco: Never Used  Substance Use Topics  . Alcohol use: Not Currently  . Drug use: Never     Family Hx: The patient's family history includes Cancer in his brother; Hypertension in his mother.  ROS:   Please see the history of present illness.    Positive for back pain and difficulty sleeping.  Positive for fatigue.  All other systems reviewed and are negative.   Labs/Other Tests and Data Reviewed:    Recent Labs: 01/16/2019: Hemoglobin 14.6; Platelets 244; TSH 1.420 01/26/2019: ALT 22; BUN 10; Creatinine, Ser 1.29; Potassium 4.5; Sodium 137   Recent Lipid Panel Lab Results  Component Value Date/Time   CHOL 219 (H) 07/07/2018  02:32 PM   TRIG 54 07/07/2018 02:32 PM   HDL 74 07/07/2018 02:32 PM   CHOLHDL 3.0 07/07/2018 02:32 PM   LDLCALC 134 (H) 07/07/2018 02:32 PM    Wt Readings from Last 3 Encounters:  02/11/19 220 lb (99.8 kg)  01/16/19 215 lb 2 oz (97.6 kg)  09/10/18 238 lb (108 kg)     Objective:    Vital Signs:  BP 120/83 (BP Location: Left Arm, Patient Position: Sitting, Cuff Size: Normal)   Pulse 79   Ht 6\' 4"  (1.93 m)   Wt 220 lb (99.8 kg)   BMI 26.78 kg/m      ASSESSMENT & PLAN:    PALPITATIONS: After careful review I think is unlikely that he has a rhythm like atrial fibrillation.  There is been no history of structural heart disease so it is less likely that he is having significant dysrhythmia with high risk potential such as ventricular tachycardia.  He associates this all with anxiety and that may well be.  At this point he and I decided that we would follow this conservatively to see how he does with his newly prescribed Cymbalta.  If he gets more rather than fewer palpitations then I would order a monitor to follow-up on this.  At this point no change in therapy.  I do note that he has had lab work to include normal CBC and TSH as well as electrolytes.  I reviewed these labs with him.  COVID-19 Education: The signs and symptoms of COVID-19 were discussed with the patient and how to seek care for testing (follow up with PCP or arrange E-visit).  The importance of social distancing was discussed today.  Patient Risk:   After full review of this patient's clinical status, I feel that they are at least moderate risk at this time.  Time:   Today, I have spent 25 minutes with the patient with telehealth technology discussing symptoms as above and Covid precautions.     Medication Adjustments/Labs and Tests Ordered: Current medicines are reviewed at length with the patient today.  Concerns regarding medicines are outlined above.  Tests Ordered: No orders of the defined types were placed in  this encounter.  Medication Changes: No orders of the defined types were placed in this encounter.   Disposition:  Follow up prn  Signed, Rollene Rotunda, MD  02/11/2019 12:46 PM    [facility]

## 2019-02-11 NOTE — Patient Instructions (Signed)
Medication Instructions:  The current medical regimen is effective;  continue present plan and medications.  Follow-Up: Follow up as needed with Dr Hochrein.  Thank you for choosing [facility]!!     

## 2019-02-24 ENCOUNTER — Encounter: Payer: Self-pay | Admitting: Family Medicine

## 2019-02-24 ENCOUNTER — Other Ambulatory Visit: Payer: Self-pay

## 2019-02-24 ENCOUNTER — Ambulatory Visit (INDEPENDENT_AMBULATORY_CARE_PROVIDER_SITE_OTHER): Payer: Medicaid Other | Admitting: Family Medicine

## 2019-02-24 DIAGNOSIS — F5105 Insomnia due to other mental disorder: Secondary | ICD-10-CM | POA: Diagnosis not present

## 2019-02-24 DIAGNOSIS — F322 Major depressive disorder, single episode, severe without psychotic features: Secondary | ICD-10-CM

## 2019-02-24 DIAGNOSIS — F99 Mental disorder, not otherwise specified: Secondary | ICD-10-CM

## 2019-02-24 DIAGNOSIS — N410 Acute prostatitis: Secondary | ICD-10-CM | POA: Diagnosis not present

## 2019-02-24 DIAGNOSIS — F411 Generalized anxiety disorder: Secondary | ICD-10-CM

## 2019-02-24 DIAGNOSIS — M5416 Radiculopathy, lumbar region: Secondary | ICD-10-CM

## 2019-02-24 MED ORDER — DULOXETINE HCL 60 MG PO CPEP: 60.0000 mg | ORAL_CAPSULE | Freq: Two times a day (BID) | ORAL | 2 refills | Status: DC

## 2019-02-24 MED ORDER — CIPROFLOXACIN HCL 500 MG PO TABS: 500.0000 mg | ORAL_TABLET | Freq: Two times a day (BID) | ORAL | 0 refills | Status: DC

## 2019-02-24 NOTE — Progress Notes (Signed)
Subjective:  Patient ID: Tyrone Nelson, male    DOB: 1971/03/31  Age: 48 y.o. MRN: [id number]  CC: No chief complaint on file.   HPI Tyrone Nelson presents for anxiety and depression. Mind races all the time, day and night. Feeling like hurting people that annoy him. Says he is not at the point of acting on this. Still feeling bad about where he is in life. Can sleep with trazodone. If not using it he is up all night ruminating on his situation.  Not able to work due to lumbar radiculopathy causing pain and numbness in legs. He is not steady on his feet. Has been falling a lot. Denies serious injury. Saw neurosurgery residents. Felt he was being "blown off." They told him he would need to see a specialist for disability paperwork even though their service had done his surgery. Subsequently his Disability application to SSI was denied.  This in turn has been feeding his feelings of hopelessness. He has not responded to  Cymbalta. Feels no better than at last discussion.  Prosatate sx better, but not gone.  [facility] 01/16/2019 09/10/2018 07/28/2018  Decreased Interest 2 0 2  Down, Depressed, Hopeless 2 0 3  PHQ - 2 Score 4 0 5  Altered sleeping 2 - 2  Tired, decreased energy 2 - 2  Change in appetite 2 - 2  Feeling bad or failure about yourself  2 - 2  Trouble concentrating 2 - 2  Moving slowly or fidgety/restless 2 - 0  Suicidal thoughts 0 - 1  PHQ-9 Score 16 - 16  Difficult doing work/chores Somewhat difficult - -    History Tyrone Minshillip has a past medical history of Arthritis, Depression, and Hypertension.   He has a past surgical history that includes Spine surgery (2017).   His family history includes Cancer in his brother; Hypertension in his mother.He reports that he has been smoking cigarettes. He has a 5.00 pack-year smoking history. He has never used smokeless tobacco. He reports previous alcohol use. He reports that he does not use drugs.    ROS Review of  Systems  Constitutional: Negative for fever.  HENT: Positive for tinnitus.   Respiratory: Negative for shortness of breath.   Cardiovascular: Negative for chest pain.  Musculoskeletal: Negative for arthralgias.  Skin: Negative for rash.    Objective:  There were no vitals taken for this visit.  BP Readings from Last 3 Encounters:  02/11/19 120/83  01/16/19 91/65  09/10/18 (!) 129/92    Wt Readings from Last 3 Encounters:  02/11/19 220 lb (99.8 kg)  01/16/19 215 lb 2 oz (97.6 kg)  09/10/18 238 lb (108 kg)     Physical Exam  Phone visit  Assessment & Plan:   Diagnoses and all orders for this visit:  Lumbar radiculopathy -     Ambulatory referral to Orthopedics  GAD (generalized anxiety disorder) -     Ambulatory referral to Psychiatry  Current severe episode of major depressive disorder without psychotic features without prior episode (HCC)  Insomnia due to other mental disorder  Acute prostatitis  Other orders -     ciprofloxacin (CIPRO) 500 MG tablet; Take 1 tablet (500 mg total) by mouth 2 (two) times daily. For prostate. Take all of these. -     DULoxetine (CYMBALTA) 60 MG capsule; Take 1 capsule (60 mg total) by mouth 2 (two) times daily.   Virtual Visit via telephone Note  I discussed the limitations, risks, security and  privacy concerns of performing an evaluation and management service by telephone and the availability of in person appointments. I also discussed with the patient that there may be a patient responsible charge related to this service. The patient expressed understanding and agreed to proceed. Pt. Is at home. Dr. Darlyn Read is in his office.  Follow Up Instructions:   I discussed the assessment and treatment plan with the patient. The patient was provided an opportunity to ask questions and all were answered. The patient agreed with the plan and demonstrated an understanding of the instructions.   The patient was advised to call back or seek  an in-person evaluation if the symptoms worsen or if the condition fails to improve as anticipated.  Visit started: 11:02 Call ended:  11:31 Total minutes including chart review and phone contact time: 42     I have changed Tyrone Nelson's DULoxetine. I am also having him maintain his amitriptyline, gabapentin, trazodone, diltiazem, and ciprofloxacin.  Allergies as of 02/24/2019   No Known Allergies     Medication List       Accurate as of February 24, 2019 11:40 AM. Always use your most recent med list.        amitriptyline 50 MG tablet Commonly known as:  ELAVIL Take 1 tablet (50 mg total) by mouth at bedtime. Cancel refills for the 25 mg strength   ciprofloxacin 500 MG tablet Commonly known as:  Cipro Take 1 tablet (500 mg total) by mouth 2 (two) times daily. For prostate. Take all of these.   diltiazem 240 MG 24 hr capsule Commonly known as:  Cardizem CD Take 1 capsule (240 mg total) by mouth daily. For blood pressure   DULoxetine 60 MG capsule Commonly known as:  CYMBALTA Take 1 capsule (60 mg total) by mouth 2 (two) times daily.   gabapentin 300 MG capsule Commonly known as:  NEURONTIN Take 5 capsules (1,500 mg total) by mouth 2 (two) times daily.   trazodone 300 MG tablet Commonly known as:  DESYREL Take 1 tablet (300 mg total) by mouth at bedtime as needed for sleep.        Follow-up: Return in about 6 weeks (around 04/07/2019).  Mechele Claude, M.D.

## 2019-03-11 ENCOUNTER — Telehealth: Payer: Self-pay

## 2019-03-11 NOTE — Telephone Encounter (Signed)
FYI, patient declined referral to orthopedic.

## 2019-03-31 ENCOUNTER — Telehealth: Payer: Self-pay | Admitting: Family Medicine
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
## 2019-04-20 ENCOUNTER — Other Ambulatory Visit: Payer: Self-pay | Admitting: Family Medicine

## 2019-04-23 ENCOUNTER — Other Ambulatory Visit: Payer: Self-pay

## 2019-04-23 ENCOUNTER — Encounter (HOSPITAL_COMMUNITY): Payer: Self-pay | Admitting: Psychiatry

## 2019-04-23 ENCOUNTER — Ambulatory Visit (INDEPENDENT_AMBULATORY_CARE_PROVIDER_SITE_OTHER): Payer: Medicaid Other | Admitting: Psychiatry

## 2019-04-23 DIAGNOSIS — F332 Major depressive disorder, recurrent severe without psychotic features: Secondary | ICD-10-CM

## 2019-04-23 DIAGNOSIS — F1010 Alcohol abuse, uncomplicated: Secondary | ICD-10-CM | POA: Insufficient documentation

## 2019-04-23 MED ORDER — VENLAFAXINE HCL ER 150 MG PO CP24: ORAL_CAPSULE | ORAL | 0 refills | Status: DC

## 2019-04-23 MED ORDER — VENLAFAXINE HCL ER 37.5 MG PO CP24: ORAL_CAPSULE | ORAL | 0 refills | Status: DC

## 2019-04-23 NOTE — Patient Instructions (Signed)
1. Change medication as follows Week 1: Decrease duloxetine 60 mg daily, start venlafaxine 37.5 mg daily  Week 2: Discontinue duloxetine, increase venlafaxine 75 mg daily  Week 3: Increase venlafaxine 150 mg daily  2. Referral to therapy 3. Next appointment: 7/7 at 1:30

## 2019-05-11 ENCOUNTER — Other Ambulatory Visit: Payer: Self-pay

## 2019-05-11 NOTE — Progress Notes (Signed)
Virtual Visit via Video Note  I connected with Tyrone Nelson on 05/12/19 at  4:20 PM EDT by a video enabled telemedicine application and verified that I am speaking with the correct person using two identifiers.   I discussed the limitations of evaluation and management by telemedicine and the availability of in person appointments. The patient expressed understanding and agreed to proceed.   I discussed the assessment and treatment plan with the patient. The patient was provided an opportunity to ask questions and all were answered. The patient agreed with the plan and demonstrated an understanding of the instructions.   The patient was advised to call back or seek an in-person evaluation if the symptoms worsen or if the condition fails to improve as anticipated.  I provided 15 minutes of non-face-to-face time during this encounter.   Neysa Hottereina Sherline Eberwein, MD    [facility] MD/PA/NP OP Progress Note  05/12/2019 4:57 PM Tyrone Nelson  MRN:  [id number]  Chief Complaint:  Chief Complaint    Depression; Follow-up     HPI:  This is a follow-up appointment for depression.  He states that he has been feeling a little better since the last visit.  He has been working on houses.  He feels frustrated that he cannot complete tasks although he tries to make some goal for a day. He also states that he would not be able to do things as he used to do. He agrees that he is doing more things compared to last month. He agrees to have compassion to himself and work on things which he might be interested.  He found himself irritable this morning, although he denies any HI.  He sees his mother every day.  He sleeps better.  He feels fatigue.  He has difficulty in concentration.  He has good appetite.  Denies SI, HI.  He is anxious and tense at times.  He denies panic attacks.  He has not drink any alcohol since the last visit. He denies any side effect after starting venlafaxine.  Visit Diagnosis:    ICD-10-CM   1. MDD  (major depressive disorder), recurrent episode, moderate (HCC)  F33.1     Past Psychiatric History: Please see initial evaluation for full details. I have reviewed the history. No updates at this time.     Past Medical History:  Past Medical History:  Diagnosis Date  . Arthritis   . Depression   . Hypertension     Past Surgical History:  Procedure Laterality Date  . SPINE SURGERY  2017   vertebrae putting pressure on spinal cord    Family Psychiatric History: Please see initial evaluation for full details. I have reviewed the history. No updates at this time.     Family History:  Family History  Problem Relation Age of Onset  . Hypertension Mother   . Cancer Brother     Social History:  Social History   Socioeconomic History  . Marital status: Divorced    Spouse name: Not on file  . Number of children: Not on file  . Years of education: Not on file  . Highest education level: Not on file  Occupational History  . Not on file  Social Needs  . Financial resource strain: Not on file  . Food insecurity    Worry: Not on file    Inability: Not on file  . Transportation needs    Medical: Not on file    Non-medical: Not on file  Tobacco Use  . Smoking  status: Current Every Day Smoker    Packs/day: 0.50    Years: 10.00    Pack years: 5.00    Types: Cigarettes    Last attempt to quit: 04/06/2018    Years since quitting: 1.0  . Smokeless tobacco: Never Used  Substance and Sexual Activity  . Alcohol use: Not Currently  . Drug use: Never  . Sexual activity: Not Currently    Birth control/protection: None  Lifestyle  . Physical activity    Days per week: Not on file    Minutes per session: Not on file  . Stress: Not on file  Relationships  . Social Musicianconnections    Talks on phone: Not on file    Gets together: Not on file    Attends religious service: Not on file    Active member of club or organization: Not on file    Attends meetings of clubs or  organizations: Not on file    Relationship status: Not on file  Other Topics Concern  . 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      Allergies: No Known Allergies  Metabolic Disorder Labs: No results found for: HGBA1C, MPG No results found for: PROLACTIN Lab Results  Component Value Date   CHOL 219 (H) 07/07/2018   TRIG 54 07/07/2018   HDL 74 07/07/2018   CHOLHDL 3.0 07/07/2018   LDLCALC 134 (H) 07/07/2018   Lab Results  Component Value Date   TSH 1.420 01/16/2019    Therapeutic Level Labs: No results found for: LITHIUM No results found for: VALPROATE No components found for:  CBMZ  Current Medications: Current Outpatient Medications  Medication Sig Dispense Refill  . ciprofloxacin (CIPRO) 500 MG tablet Take 1 tablet (500 mg total) by mouth 2 (two) times daily. For prostate. Take all of these. 30 tablet 0  . diltiazem (CARDIZEM CD) 240 MG 24 hr capsule TAKE 1 CAPSULE (240 MG TOTAL) BY MOUTH DAILY. FOR BLOOD PRESSURE 30 capsule 1  . gabapentin (NEURONTIN) 300 MG capsule Take 5 capsules (1,500 mg total) by mouth 2 (two) times daily. 300 capsule 2  . traZODone (DESYREL) 300 MG tablet Take 1 tablet (300 mg total) by mouth at bedtime as needed for sleep. 30 tablet 2  . venlafaxine XR (EFFEXOR-XR) 150 MG 24 hr capsule Take 1 capsule (150 mg total) by mouth daily. 30 capsule 0  . venlafaxine XR (EFFEXOR-XR) 37.5 MG 24 hr capsule 37.5 mg daily for one week, then 75 mg daily 21 capsule 0   No current facility-administered medications for this visit.      Musculoskeletal: Strength & Muscle Tone: N/A Gait & Station: N/A Patient leans: N/A  Psychiatric Specialty Exam: Review of Systems  Psychiatric/Behavioral: Positive for depression. Negative for hallucinations, memory loss, substance abuse and suicidal ideas. The patient is nervous/anxious. The patient does not have insomnia.   All other systems reviewed and are negative.   There were no  vitals taken for this visit.There is no height or weight on file to calculate BMI.  General Appearance: Fairly Groomed  Eye Contact:  Good  Speech:  Clear and Coherent  Volume:  Normal  Mood:  Depressed  Affect:  Appropriate, Blunt and Congruent- improving, smiles at times  Thought Process:  Coherent  Orientation:  Full (Time, Place, and Person)  Thought Content: Logical   Suicidal Thoughts:  No  Homicidal Thoughts:  No  Memory:  Immediate;   Good  Judgement:  Good  Insight:  Fair  Psychomotor Activity:  Normal  Concentration:  Concentration: Good and Attention Span: Good  Recall:  Good  Fund of Knowledge: Good  Language: Good  Akathisia:  No  Handed:  Right  AIMS (if indicated): not done  Assets:  Communication Skills Desire for Improvement  ADL's:  Intact  Cognition: WNL  Sleep:  Fair   Screenings: GAD-7     Office Visit from 01/16/2019 in [place]  Total GAD-7 Score  21    PHQ2-9     Office Visit from 01/16/2019 in [facility] Visit from 09/10/2018 in [facility] Office Visit from 07/28/2018 in [facility] Visit from 07/07/2018 in [facility]  PHQ-2 Total Score  4  0  5  5  PHQ-9 Total Score  16  -  16  16       Assessment and Plan:  Jaecion Dempster is a 48 y.o. 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   # MDD, moderate, recurrent without psychotic features Exam is notable for less blunted affect, and there has been improvement in depressive symptoms after switching from duloxetine to venlafaxine. Psychosocial stressors includes physical condition of radiculopathy.   Will continue current dose of venlafaxine at this time to see if it exerts its full benefit.  Discussed potential side effect of hypertension.  Will greatly benefit from CBT/supportive therapy for demoralization; will make referral again.  Discussed behavioral activation.   # Binge alcohol use He denies any alcohol use since her last visit.  Will continue to monitor.   Plan 1. Continue venlafaxine 150 mg daily 2. Referral to therapy 3. Next appointment: 7/28 at 10 AM for 30 mins, video - on gabapentin 1500 mg BID  The patient demonstrates the following risk factors for suicide: Chronic risk factors for suicide include: psychiatric disorder of depression and chronic pain. Acute risk factors for suicide include: unemployment and loss (financial, interpersonal, professional). Protective factors for this patient include: positive social support, responsibility to others (children, family), coping skills and hope for the future. Considering these factors, the overall suicide risk at this point appears to be low. Patient is appropriate for outpatient follow up.  Norman Clay, MD 05/12/2019, 4:57 PM

## 2019-05-12 ENCOUNTER — Ambulatory Visit (INDEPENDENT_AMBULATORY_CARE_PROVIDER_SITE_OTHER): Payer: Medicaid Other | Admitting: Psychiatry

## 2019-05-12 ENCOUNTER — Encounter (HOSPITAL_COMMUNITY): Payer: Self-pay | Admitting: Psychiatry

## 2019-05-12 DIAGNOSIS — F331 Major depressive disorder, recurrent, moderate: Secondary | ICD-10-CM

## 2019-05-12 MED ORDER — VENLAFAXINE HCL ER 150 MG PO CP24: 150.0000 mg | ORAL_CAPSULE | Freq: Every day | ORAL | 0 refills | Status: DC

## 2019-05-12 NOTE — Patient Instructions (Signed)
1. Continue venlafaxine 150 mg daily 2. Referral to therapy 3. Next appointment: 7/28 at 10 AM

## 2019-05-13 ENCOUNTER — Other Ambulatory Visit: Payer: Self-pay

## 2019-05-13 ENCOUNTER — Ambulatory Visit: Payer: Medicaid Other | Admitting: Family Medicine

## 2019-05-13 ENCOUNTER — Encounter: Payer: Self-pay | Admitting: Family Medicine

## 2019-05-13 VITALS — BP 150/103 | HR 83 | Temp 98.2°F | Ht 76.0 in | Wt 210.0 lb

## 2019-05-13 DIAGNOSIS — M545 Low back pain, unspecified: Secondary | ICD-10-CM

## 2019-05-13 LAB — URINALYSIS
Bilirubin, UA: NEGATIVE
Glucose, UA: NEGATIVE
Ketones, UA: NEGATIVE
Leukocytes,UA: NEGATIVE
Nitrite, UA: NEGATIVE
Protein,UA: NEGATIVE
Specific Gravity, UA: 1.025 (ref 1.005–1.030)
Urobilinogen, Ur: 0.2 mg/dL (ref 0.2–1.0)
pH, UA: 6 (ref 5.0–7.5)

## 2019-05-13 NOTE — Progress Notes (Addendum)
Subjective:  Patient ID: Tyrone Nelson, male    DOB: Jun 07, 1971  Age: 48 y.o. MRN: [id number]  CC: Back Pain and right leg pain   HPI Tyrone Nelson presents for patient tells me that Tyrone Nelson is having increasing right lower back pain radiating to the buttocks and hip.  Tyrone Nelson describes this as a pain from hell.  It is rated 10/10 on pain scale.  This is primarily when Tyrone Nelson has been up and around for a little while walking on it.  It is relieved somewhat with sitting and resting.  The pain is chronic in his buttock and hip but the pain in the back started about 4 5 days ago and is much worse than Tyrone Nelson has previously experienced.  Additionally the patient said gabapentin was not helping at all at the dose of 900 mg twice daily so Tyrone Nelson discontinued it.  Depression screen [facility] 2/9 05/13/2019 01/16/2019 09/10/2018  Decreased Interest 0 2 0  Down, Depressed, Hopeless 0 2 0  PHQ - 2 Score 0 4 0  Altered sleeping - 2 -  Tired, decreased energy - 2 -  Change in appetite - 2 -  Feeling bad or failure about yourself  - 2 -  Trouble concentrating - 2 -  Moving slowly or fidgety/restless - 2 -  Suicidal thoughts - 0 -  PHQ-9 Score - 16 -  Difficult doing work/chores - Somewhat difficult -    History Tyrone Nelson has a past medical history of Arthritis, Depression, and Hypertension.   Tyrone Nelson has a past surgical history that includes Spine surgery (2017).   His family history includes Cancer in his brother; Hypertension in his mother.Tyrone Nelson reports that Tyrone Nelson has been smoking cigarettes. Tyrone Nelson has a 5.00 pack-year smoking history. Tyrone Nelson has never used smokeless tobacco. Tyrone Nelson reports previous alcohol use. Tyrone Nelson reports that Tyrone Nelson does not use drugs.    ROS Review of Systems  Constitutional: Negative for chills, diaphoresis and fever.  HENT: Negative for sore throat.   Respiratory: Negative for shortness of breath.   Cardiovascular: Negative for chest pain.  Gastrointestinal: Negative for abdominal pain.  Musculoskeletal: Positive for  arthralgias, back pain, gait problem and myalgias. Negative for neck pain.  Skin: Negative for rash.  Neurological: Positive for weakness. Negative for numbness.    Objective:  BP (!) 150/103   Pulse 83   Temp 98.2 F (36.8 C) (Oral)   Ht 6\' 4"  (1.93 m)   Wt 210 lb (95.3 kg)   BMI 25.56 kg/m   BP Readings from Last 3 Encounters:  05/13/19 (!) 150/103  02/11/19 120/83  01/16/19 91/65    Wt Readings from Last 3 Encounters:  05/13/19 210 lb (95.3 kg)  02/11/19 220 lb (99.8 kg)  01/16/19 215 lb 2 oz (97.6 kg)     Physical Exam Vitals signs reviewed.  Constitutional:      General: Tyrone Nelson is in acute distress.     Appearance: Tyrone Nelson is well-developed.  HENT:     Head: Normocephalic.  Eyes:     Pupils: Pupils are equal, round, and reactive to light.  Neck:     Musculoskeletal: Normal range of motion.  Cardiovascular:     Rate and Rhythm: Normal rate and regular rhythm.     Heart sounds: Normal heart sounds. No murmur.  Pulmonary:     Effort: Pulmonary effort is normal.     Breath sounds: Normal breath sounds.  Abdominal:     Tenderness: There is no abdominal tenderness.  Musculoskeletal:  General: Tenderness (RLB, lateral right hip) present.     Comments: SLR negative bilaterally   Skin:    General: Skin is warm and dry.  Neurological:     Mental Status: Tyrone Nelson is alert and oriented to person, place, and time.     Deep Tendon Reflexes: Reflexes are normal and symmetric.  Psychiatric:        Behavior: Behavior normal.        Thought Content: Thought content normal.       Assessment & Plan:   Tyrone Nelson was seen today for back pain and right leg pain.  Diagnoses and all orders for this visit:  Acute right-sided low back pain without sciatica -     Urinalysis -     Urine Culture  Other orders -     pregabalin (LYRICA) 50 MG capsule; 1 qhs X7 days , then 2 qhs X 7d, then 3 qhs X 7d, then 4 qhs       I have discontinued Tyrone Nelson gabapentin and  ciprofloxacin. I am also having him start on pregabalin. Additionally, I am having him maintain his trazodone, diltiazem, and venlafaxine XR.  Allergies as of 05/13/2019   No Known Allergies     Medication List       Accurate as of May 13, 2019 11:59 PM. If you have any questions, ask your nurse or doctor.        STOP taking these medications   ciprofloxacin 500 MG tablet Commonly known as: Cipro Stopped by: Mechele ClaudeWarren Tyheem Boughner, MD   gabapentin 300 MG capsule Commonly known as: NEURONTIN Stopped by: Mechele ClaudeWarren Azarion Hove, MD     TAKE these medications   diltiazem 240 MG 24 hr capsule Commonly known as: CARDIZEM CD TAKE 1 CAPSULE (240 MG TOTAL) BY MOUTH DAILY. FOR BLOOD PRESSURE   pregabalin 50 MG capsule Commonly known as: Lyrica 1 qhs X7 days , then 2 qhs X 7d, then 3 qhs X 7d, then 4 qhs Started by: Mechele ClaudeWarren Ming Kunka, MD   trazodone 300 MG tablet Commonly known as: DESYREL Take 1 tablet (300 mg total) by mouth at bedtime as needed for sleep.   venlafaxine XR 150 MG 24 hr capsule Commonly known as: EFFEXOR-XR Take 1 capsule (150 mg total) by mouth daily. What changed: Another medication with the same name was removed. Continue taking this medication, and follow the directions you see here. Changed by: Mechele ClaudeWarren Mujahid Jalomo, MD        Follow-up: Return in 6 weeks (on 06/24/2019), or if symptoms worsen or fail to improve.  Mechele ClaudeWarren Darry Kelnhofer, M.D.

## 2019-05-15 ENCOUNTER — Telehealth: Payer: Self-pay | Admitting: Family Medicine

## 2019-05-15 LAB — URINE CULTURE: Organism ID, Bacteria: NO GROWTH

## 2019-05-15 MED ORDER — PREGABALIN 50 MG PO CAPS: ORAL_CAPSULE | ORAL | 1 refills | Status: DC

## 2019-05-15 NOTE — Telephone Encounter (Signed)
Please contact the patient I sent in Lyrica

## 2019-05-15 NOTE — Telephone Encounter (Signed)
Patient states that he was to be prescribed something in place of gabapentin

## 2019-05-15 NOTE — Telephone Encounter (Signed)
Pt called and aware

## 2019-05-15 NOTE — Addendum Note (Signed)
Addended by: Claretta Fraise on: 05/15/2019 01:02 PM   Modules accepted: Orders

## 2019-05-19 ENCOUNTER — Ambulatory Visit (HOSPITAL_COMMUNITY): Payer: Medicaid Other | Admitting: Psychiatry

## 2019-06-02 NOTE — Progress Notes (Signed)
Virtual Visit via Video Note  I connected with Tyrone Nelson on 06/09/19 at 10:20 AM EDT by a video enabled telemedicine application and verified that I am speaking with the correct person using two identifiers.   I discussed the limitations of evaluation and management by telemedicine and the availability of in person appointments. The patient expressed understanding and agreed to proceed.    I discussed the assessment and treatment plan with the patient. The patient was provided an opportunity to ask questions and all were answered. The patient agreed with the plan and demonstrated an understanding of the instructions.   The patient was advised to call back or seek an in-person evaluation if the symptoms worsen or if the condition fails to improve as anticipated.  I provided 25 minutes of non-face-to-face time during this encounter.   Neysa Hottereina Nemiah Kissner, MD    [facility]/PA/[facility] OP Progress Note  06/09/2019 11:01 AM Tyrone Nelson  MRN:  [id number]  Chief Complaint:  Chief Complaint    Depression; Follow-up     HPI:  This is a follow-up appointment for depression.  He states that he has not been feeling some good over the past few days, although he denies any recent new stressors. He is "not happy with where I am. "He states that "just being in a funk" and has "no motivation." He then asks how long it would take for him to come out of the hospital if he were to be "lock up." On further elaboration, he states that he thinks ahead in case he needs to be admitted. Provided psycho education about psychiatry admission.  He believes that he will be "alright" not being admitted at this time. He has had SI with some plans; he declined to talk about the plans as he does not want to act on it. He agrees to contact emergency resources and/or his mother if his thoughts worsens.  Although he initially agrees for this Clinical research associatewriter to contact his mother, he later declines it, stating that he does not want her to get  worried as she suffers from hypertension. However, again he is willing to talk with her if his condition worsens. He has insomnia.  He feels fatigue.  He feels depressed.  He has difficulty in concentration with racing thoughts.  He denies HI; he tends to isolate himself as he becomes easily irritable. He denies alcohol use or drug use. He agrees to structure a day, and helps his father/uncle for some house work as he used to enjoy doing Music therapistcarpenter work.    Visit Diagnosis:    ICD-10-CM   1. Severe episode of recurrent major depressive disorder, without psychotic features (HCC)  F33.2     Past Psychiatric History: Please see initial evaluation for full details. I have reviewed the history. No updates at this time.     Past Medical History:  Past Medical History:  Diagnosis Date  . Arthritis   . Depression   . Hypertension     Past Surgical History:  Procedure Laterality Date  . SPINE SURGERY  2017   vertebrae putting pressure on spinal cord    Family Psychiatric History: Please see initial evaluation for full details. I have reviewed the history. No updates at this time.     Family History:  Family History  Problem Relation Age of Onset  . Hypertension Mother   . Cancer Brother     Social History:  Social History   Socioeconomic History  . Marital status: Divorced  Spouse name: Not on file  . Number of children: Not on file  . Years of education: Not on file  . Highest education level: Not on file  Occupational History  . Not on file  Social Needs  . Financial resource strain: Not on file  . Food insecurity    Worry: Not on file    Inability: Not on file  . Transportation needs    Medical: Not on file    Non-medical: Not on file  Tobacco Use  . Smoking status: Current Every Day Smoker    Packs/day: 0.50    Years: 10.00    Pack years: 5.00    Types: Cigarettes    Last attempt to quit: 04/06/2018    Years since quitting: 1.1  . Smokeless tobacco: Never  Used  Substance and Sexual Activity  . Alcohol use: Not Currently  . Drug use: Never  . Sexual activity: Not Currently    Birth control/protection: None  Lifestyle  . Physical activity    Days per week: Not on file    Minutes per session: Not on file  . Stress: Not on file  Relationships  . Social Musicianconnections    Talks on phone: Not on file    Gets together: Not on file    Attends religious service: Not on file    Active member of club or organization: Not on file    Attends meetings of clubs or organizations: Not on file    Relationship status: Not on file  Other Topics Concern  . 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      Allergies: No Known Allergies  Metabolic Disorder Labs: No results found for: HGBA1C, MPG No results found for: PROLACTIN Lab Results  Component Value Date   CHOL 219 (H) 07/07/2018   TRIG 54 07/07/2018   HDL 74 07/07/2018   CHOLHDL 3.0 07/07/2018   LDLCALC 134 (H) 07/07/2018   Lab Results  Component Value Date   TSH 1.420 01/16/2019    Therapeutic Level Labs: No results found for: LITHIUM No results found for: VALPROATE No components found for:  CBMZ  Current Medications: Current Outpatient Medications  Medication Sig Dispense Refill  . diltiazem (CARDIZEM CD) 240 MG 24 hr capsule TAKE 1 CAPSULE (240 MG TOTAL) BY MOUTH DAILY. FOR BLOOD PRESSURE 30 capsule 1  . pregabalin (LYRICA) 50 MG capsule 1 qhs X7 days , then 2 qhs X 7d, then 3 qhs X 7d, then 4 qhs 120 capsule 1  . traZODone (DESYREL) 300 MG tablet Take 1 tablet (300 mg total) by mouth at bedtime as needed for sleep. 30 tablet 2  . venlafaxine XR (EFFEXOR XR) 75 MG 24 hr capsule 225 mg daily (75 mg + 150 mg) 30 capsule 0  . venlafaxine XR (EFFEXOR-XR) 150 MG 24 hr capsule 225 mg daily (75 mg + 150 mg) 30 capsule 0   No current facility-administered medications for this visit.      Musculoskeletal: Strength & Muscle Tone: N/A Gait & Station:  N/A Patient leans: N/A  Psychiatric Specialty Exam: ROS  There were no vitals taken for this visit.There is no height or weight on file to calculate BMI.  General Appearance: Fairly Groomed  Eye Contact:  Good  Speech:  Clear and Coherent  Volume:  Normal  Mood:  Depressed  Affect:  Appropriate, Congruent and Restricted, sad  Thought Process:  Coherent  Orientation:  Full (Time, Place, and Person)  Thought Content: Logical   Suicidal Thoughts:  No  Homicidal Thoughts:  No  Memory:  Immediate;   Good  Judgement:  Good  Insight:  Fair  Psychomotor Activity:  Normal  Concentration:  Concentration: Good and Attention Span: Good  Recall:  Good  Fund of Knowledge: Good  Language: Good  Akathisia:  No  Handed:  Right  AIMS (if indicated): not done  Assets:  Communication Skills Desire for Improvement  ADL's:  Intact  Cognition: WNL  Sleep:  Poor   Screenings: GAD-7     Office Visit from 01/16/2019 in [place]  Total GAD-7 Score  21    PHQ2-9     Office Visit from 05/13/2019 in [place] Visit from 01/16/2019 in [place] Visit from 09/10/2018 in [place] Office Visit from 07/28/2018 in [place] Visit from 07/07/2018 in [place]  PHQ-2 Total Score  0  4  0  5  5  PHQ-9 Total Score  -  16  -  16  16       Assessment and Plan:  Carder Yin is a 48 y.o. 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   # MDD, severe, recurrent without psychotic features Patient reports worsening in depressive symptoms since the last visit.   Psychosocial stressors includes physical condition of radiculopathy.  Will uptitrate venlafaxine to target residual mood symptoms.  Discussed potential side effect of hypertension.  He will greatly benefit from CBT/supportive therapy; will make referral again.  Discussed behavioral activation.   # SI He reports fleeting SI with some plan, although he does not elaborate it. He denies any intent/attempt since the last visit. He agrees to lock guns where he has access to his parents' (who lives next door). Although he declines his parents to be contacted (as he is concerned that they/especially his mother would be worried to much about him), he agrees with contacting emergency resources as needed. Will have sooner   # Binge alcohol use He denies any alcohol use since the last visit.  Will continue to monitor.   Plan 1. Increase venlafaxine 225 mg daily 2. Referral to therapy 3. Next appointment: 8/5 at 10:20 for 30 mins, video - on gabapentin 1500 mg BID  I have reviewed suicide assessment in detail. updated the following assessment.   The patient demonstrates the following risk factors for suicide: Chronic risk factors for suicide include:psychiatric disorder ofdepressionand chronic pain. Acute risk factorsfor suicide include: unemployment and loss (financial, interpersonal, professional). Protective factorsfor this patient include: positive social support, responsibility to others (children, family), coping skills. He is amenable to treatment plans, and is future oriented. Considering these factors, the overall suicide risk at this point appears to bemoderate, but not at imminent risk to self. Patientisappropriate for outpatient follow up.   The duration of this appointment visit was 25 minutes of non face-to-face time with the patient.  Greater than 50% of this time was spent in counseling, explanation of  diagnosis, planning of further management, and coordination of care.  Norman Clay, MD 06/09/2019, 11:01 AM

## 2019-06-09 ENCOUNTER — Other Ambulatory Visit: Payer: Self-pay

## 2019-06-09 ENCOUNTER — Ambulatory Visit (INDEPENDENT_AMBULATORY_CARE_PROVIDER_SITE_OTHER): Payer: Medicaid Other | Admitting: Psychiatry

## 2019-06-09 ENCOUNTER — Encounter (HOSPITAL_COMMUNITY): Payer: Self-pay | Admitting: Psychiatry

## 2019-06-09 DIAGNOSIS — F332 Major depressive disorder, recurrent severe without psychotic features: Secondary | ICD-10-CM | POA: Diagnosis not present

## 2019-06-09 MED ORDER — VENLAFAXINE HCL ER 75 MG PO CP24: ORAL_CAPSULE | ORAL | 0 refills | Status: DC

## 2019-06-09 MED ORDER — VENLAFAXINE HCL ER 150 MG PO CP24: ORAL_CAPSULE | ORAL | 0 refills | Status: DC

## 2019-06-09 NOTE — Patient Instructions (Signed)
1. Increase venlafaxine 225 mg daily 2. Referral to therapy 3. Next appointment: 8/5 at 10:20 4. CONTACT INFORMATION  What to do if you need to get in touch with someone regarding a psychiatric issue:  1. EMERGENCY: For psychiatric emergencies (if you are suicidal or if there are any other safety issues) call 911 and/or go to your nearest Emergency Room immediately.   2. IF YOU NEED SOMEONE TO TALK TO RIGHT NOW: Given my clinical responsibilities, I may not be able to speak with you over the phone for a prolonged period of time.  a. You may always call The National Suicide Prevention Lifeline at 1-800-273-TALK 805-734-3184).  b. Your county of residence will also have local crisis services. For [facility]: [place] at [phone] ([place])

## 2019-06-11 NOTE — Progress Notes (Deleted)
BH MD/PA/NP OP Progress Note  06/11/2019 1:55 PM Tyrone Nelson  MRN:  [id number]  Chief Complaint:  HPI: *** Visit Diagnosis: No diagnosis found.  Past Psychiatric History: Please see initial evaluation for full details. I have reviewed the history. No updates at this time.     Past Medical History:  Past Medical History:  Diagnosis Date  . Arthritis   . Depression   . Hypertension     Past Surgical History:  Procedure Laterality Date  . SPINE SURGERY  2017   vertebrae putting pressure on spinal cord    Family Psychiatric History: Please see initial evaluation for full details. I have reviewed the history. No updates at this time.     Family History:  Family History  Problem Relation Age of Onset  . Hypertension Mother   . Cancer Brother     Social History:  Social History   Socioeconomic History  . Marital status: Divorced    Spouse name: Not on file  . Number of children: Not on file  . Years of education: Not on file  . Highest education level: Not on file  Occupational History  . Not on file  Social Needs  . Financial resource strain: Not on file  . Food insecurity    Worry: Not on file    Inability: Not on file  . Transportation needs    Medical: Not on file    Non-medical: Not on file  Tobacco Use  . Smoking status: Current Every Day Smoker    Packs/day: 0.50    Years: 10.00    Pack years: 5.00    Types: Cigarettes    Last attempt to quit: 04/06/2018    Years since quitting: 1.1  . Smokeless tobacco: Never Used  Substance and Sexual Activity  . Alcohol use: Not Currently  . Drug use: Never  . Sexual activity: Not Currently    Birth control/protection: None  Lifestyle  . Physical activity    Days per week: Not on file    Minutes per session: Not on file  . Stress: Not on file  Relationships  . Social Musicianconnections    Talks on phone: Not on file    Gets together: Not on file    Attends religious service: Not on file    Active member of  club or organization: Not on file    Attends meetings of clubs or organizations: Not on file    Relationship status: Not on file  Other Topics Concern  . 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      Allergies: No Known Allergies  Metabolic Disorder Labs: No results found for: HGBA1C, MPG No results found for: PROLACTIN Lab Results  Component Value Date   CHOL 219 (H) 07/07/2018   TRIG 54 07/07/2018   HDL 74 07/07/2018   CHOLHDL 3.0 07/07/2018   LDLCALC 134 (H) 07/07/2018   Lab Results  Component Value Date   TSH 1.420 01/16/2019    Therapeutic Level Labs: No results found for: LITHIUM No results found for: VALPROATE No components found for:  CBMZ  Current Medications: Current Outpatient Medications  Medication Sig Dispense Refill  . diltiazem (CARDIZEM CD) 240 MG 24 hr capsule TAKE 1 CAPSULE (240 MG TOTAL) BY MOUTH DAILY. FOR BLOOD PRESSURE 30 capsule 1  . pregabalin (LYRICA) 50 MG capsule 1 qhs X7 days , then 2 qhs X 7d, then 3 qhs X 7d, then 4 qhs 120 capsule  1  . traZODone (DESYREL) 300 MG tablet Take 1 tablet (300 mg total) by mouth at bedtime as needed for sleep. 30 tablet 2  . venlafaxine XR (EFFEXOR XR) 75 MG 24 hr capsule 225 mg daily (75 mg + 150 mg) 30 capsule 0  . venlafaxine XR (EFFEXOR-XR) 150 MG 24 hr capsule 225 mg daily (75 mg + 150 mg) 30 capsule 0   No current facility-administered medications for this visit.      Musculoskeletal: Strength & Muscle Tone: N/A Gait & Station: N/A Patient leans: N/A  Psychiatric Specialty Exam: ROS  There were no vitals taken for this visit.There is no height or weight on file to calculate BMI.  General Appearance: {Appearance:22683}  Eye Contact:  {BHH EYE CONTACT:22684}  Speech:  Clear and Coherent  Volume:  Normal  Mood:  {BHH MOOD:22306}  Affect:  {Affect (PAA):22687}  Thought Process:  Coherent  Orientation:  Full (Time, Place, and Person)  Thought Content: Logical    Suicidal Thoughts:  {ST/HT (PAA):22692}  Homicidal Thoughts:  {ST/HT (PAA):22692}  Memory:  Immediate;   Good  Judgement:  {Judgement (PAA):22694}  Insight:  {Insight (PAA):22695}  Psychomotor Activity:  Normal  Concentration:  Concentration: Good and Attention Span: Good  Recall:  Good  Fund of Knowledge: Good  Language: Good  Akathisia:  No  Handed:  Right  AIMS (if indicated): not done  Assets:  Communication Skills Desire for Improvement  ADL's:  Intact  Cognition: WNL  Sleep:  {BHH GOOD/FAIR/POOR:22877}   Screenings: GAD-7     Office Visit from 01/16/2019 in [place]  Total GAD-7 Score  21    PHQ2-9     Office Visit from 05/13/2019 in [place] Visit from 01/16/2019 in [place] Visit from 09/10/2018 in [place] Visit from 07/28/2018 in [place] Visit from 07/07/2018 in [place]  PHQ-2 Total Score  0  4  0  5  5  PHQ-9 Total Score  -  16  -  16  16       Assessment and Plan:  Tyrone Nelson is a 48 y.o. 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  # MDD, severe, recurrent without psychotic features  Patient reports worsening in depressive symptoms since the last visit.  Psychosocial stressors includes physical condition of radiculopathy.  Will uptitrate venlafaxine to target residual mood symptoms.  Discussed potential side effect of hypertension.  He will greatly benefit from CBT/supportive therapy; will make referral again.  Discussed behavioral activation.   # SI He reports fleeting SI with some plan,  although he does not elaborate it. He denies any intent/attempt since the last visit. He agrees to lock guns where he has access to his parents' (who lives next door). Although he declines his parents to be contacted (as he is concerned that they/especially his mother would be worried to much about him), he agrees with contacting emergency resources as needed. Will have sooner   # Binge alcohol use He denies any alcohol use since the last visit.  Will continue to monitor.   Plan 1. Increase venlafaxine 225 mg daily 2. Referral to therapy 3. Next appointment:8/5 at 10:20 for  30 mins, video - on gabapentin 1500 mg BID   The patient demonstrates the following risk factors for suicide: Chronic risk factors for suicide include:psychiatric disorder ofdepressionand chronic pain. Acute risk factorsfor suicide include: unemployment and loss (financial, interpersonal, professional). Protective factorsfor this patient include: positive social support, responsibility to others (children, family), coping skills. He is amenable to treatment plans, and is future oriented. Considering these factors, the overall suicide risk at this point appears to bemoderate, but not at imminent risk to self. Patientisappropriate for outpatient follow up.   Neysa Hottereina Arnol Mcgibbon, MD 06/11/2019, 1:55 PM

## 2019-06-17 ENCOUNTER — Ambulatory Visit (HOSPITAL_COMMUNITY): Payer: Medicaid Other | Admitting: Psychiatry

## 2019-06-17 ENCOUNTER — Telehealth (HOSPITAL_COMMUNITY): Payer: Self-pay | Admitting: Psychiatry

## 2019-06-17 ENCOUNTER — Other Ambulatory Visit: Payer: Self-pay

## 2019-06-17 NOTE — Telephone Encounter (Signed)
Sent link for video visit through [employer] [facility]. Patient did not sign in. Called the patient  twice for appointment scheduled today. The patient did not answer the phone. Left voice message to contact the office.  

## 2019-06-18 ENCOUNTER — Ambulatory Visit (INDEPENDENT_AMBULATORY_CARE_PROVIDER_SITE_OTHER): Payer: Medicaid Other | Admitting: Psychiatry

## 2019-06-18 ENCOUNTER — Other Ambulatory Visit: Payer: Self-pay

## 2019-06-18 ENCOUNTER — Encounter (HOSPITAL_COMMUNITY): Payer: Self-pay | Admitting: Psychiatry

## 2019-06-18 DIAGNOSIS — F332 Major depressive disorder, recurrent severe without psychotic features: Secondary | ICD-10-CM | POA: Diagnosis not present

## 2019-06-18 NOTE — Progress Notes (Signed)
Virtual Visit via Video Note  I connected with Tyrone Nelson on 06/18/19 at  9:00 AM EDT by a video enabled telemedicine application and verified that I am speaking with the correct person using two identifiers.   I discussed the limitations of evaluation and management by telemedicine and the availability of in person appointments. The patient expressed understanding and agreed to proceed.  I provided 60 minutes of non-face-to-face time during this encounter.   Adah SalvagePeggy E Carver Murakami, LCSW    Comprehensive Clinical Assessment (CCA) Note  06/18/2019 Tyrone Nelson [id number]  Visit Diagnosis:      ICD-10-CM   1. Severe episode of recurrent major depressive disorder, without psychotic features (HCC)  F33.2       CCA Part One  Part One has been completed on paper by the patient.  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 I am also in constant pain" Patients Currently Reported Symptoms/Problems: "no motivation, irritable, anger" Individual's Preferences: "I want to see what is wrong with me," Type of Services Patient Feels Are Needed: Individual therapy Initial Clinical Notes/Concerns: Patient is referred for services by psychiatrist Dr. Vanetta ShawlHisada due to experiencing symtoms of anxiety and depression. He denies any psychiatric hospitalizations. He denies any involvement in outpatient therapy.  Mental Health Symptoms Depression:  Depression: Difficulty Concentrating, Fatigue, Irritability, Sleep (too much or little), Hopelessness, Worthlessness  Mania:  Mania: Irritability  Anxiety:   Anxiety: Difficulty concentrating, Fatigue, Irritability, Restlessness, Sleep, Worrying  Psychosis:  Psychosis: N/A  Trauma:  N/A  Obsessions:  Obsessions: N/A   Compulsions:  Compulsions: N/A  Inattention:  Inattention: N/A  Hyperactivity/Impulsivity:  Hyperactivity/Impulsivity: N/A  Oppositional/Defiant Behaviors:  Oppositional/Defiant Behaviors: N/A  Borderline Personality:  Emotional Irregularity: N/A  Other Mood/Personality Symptoms:  N/A   Mental Status Exam Appearance and self-care  Stature:    Weight:    Clothing:    Grooming:    Cosmetic use:    Posture/gait:   Motor activity:    Sensorium  Attention:  Attention: Normal  Concentration:  Concentration: Normal  Orientation:  Orientation: X5  Recall/memory:  Recall/Memory: Normal  Affect and Mood  Affect:  Affect: Depressed  Mood:  Mood: Depressed, Angry, Irritable  Relating  Eye contact:    Facial expression:    Attitude toward examiner:  Attitude Toward Examiner: Cooperative  Thought and Language  Speech flow: Speech Flow: Normal  Thought content:  Thought Content: Appropriate to mood and circumstances  Preoccupation:  Preoccupations: Ruminations  Hallucinations:  Hallucinations: (None)  Organization:  logical  Company secretaryxecutive Functions  Fund of Knowledge:  Fund of Knowledge: Average  Intelligence:  Intelligence: Average  Abstraction:  Abstraction: Normal  Judgement:  Judgement: Normal  Reality Testing:  Reality Testing: Realistic  Insight:  Insight: Good  Decision Making:  Decision Making: Normal  Social Functioning  Social Maturity:  Social Maturity: Isolates  Social Judgement:  Social Judgement: Normal  Stress  Stressors:  Stressors: Illness, Money(constant pain, reduced physical functioning,)  Coping Ability:  Coping Ability: Overwhelmed, Exhausted  Skill Deficits:    Supports:  mother   Family and Psychosocial History: Family history Marital status: Divorced([place] Divorced, when?: divorced in 2000 after 4 years due to irreconcilable differencies Does patient have children?:  Yes How many children?: 2(daughters ages [phone] and  [phone]) How is patient's relationship with their children?: good with oldest daughter and not so good with youngest, reside in [place]  Childhood History:  Childhood History By whom was/is the patient raised?: Both parents Additional childhood history information: Patient was born and reared [place] Description of patient's relationship with caregiver when they were a child: "had great parents, dad was a good provider and great husband but didn't open up/talk much, that's where I get it from" Patient's description of current relationship with people who raised him/her: Ok relationship with dad who opens up more, great relationship with mother How were you disciplined when you got in trouble as a child/adolescent?: father gave the "look" Does patient have siblings?: Yes Number of Siblings: 4(three brothers, one sister, patient is youngest) Description of patient's current relationship with siblings: "okay" Did patient suffer any verbal/emotional/physical/sexual abuse as a child?: No Did patient suffer from severe childhood neglect?: No Has patient ever been sexually abused/assaulted/raped as an adolescent or adult?: No Was the patient ever a victim of a crime or a disaster?: No Witnessed domestic violence?: No Has patient been effected by domestic violence as an adult?: No  CCA Part Two B  Employment/Work Situation: Employment / Work Psychologist, occupationalituation Employment situation: Unemployed(has applied for disability, been denied twice, in appeal process) What is the longest time patient has a held a job?: 7 years. 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) Are There Guns or Other Weapons in Your Home?: No  Education: Education Did Theme park managerYou Attend College?: [employer](attended college for a couple of semesters, did not  have a major) Did You Have Any Scientist, research (life sciences)pecial Interests In School?: played football Did You Have An Individualized Education Program (IIEP): No Did You Have Any Difficulty At [employer] EnergySchool?: No  Religion: Religion/Spirituality Are You A Religious Person?: Yes What is Your Religious Affiliation?: Primitive Environmental consultantBaptist  Leisure/Recreation: Leisure / Recreation Leisure and Hobbies: "doesn't do anything now, a recluse", used to like to do anything outdoors  Exercise/Diet: Exercise/Diet Do You Exercise?: Yes What Type of Exercise Do You Do?: Run/Walk How Many Times a Week Do You Exercise?: 1-3 times a week Have You Gained or Lost A Significant Amount of Weight in the Past Six Months?: No Do You Follow a Special Diet?: No Do You Have Any Trouble Sleeping?: Yes Explanation of Sleeping Difficulties: difficulty falling and staying asleep, sometimes will not sleep for 2 days  CCA Part Two C  Alcohol/Drug Use: Alcohol / Drug Use Pain Medications: See patient record Prescriptions: See patient record Over the Counter: See patient record History of alcohol / drug use?: (past use of marijuana, started using at age 48, just smoked if someone else had it, past use of alcohol, had 3 DUIs. last used marijuana and alcholol 2 months ago per his report)  CCA Part Three  ASAM's:  Six Dimensions of Multidimensional Assessment  Substance use Disorder (SUD)   Social Function:  Social Functioning Social Maturity: Isolates Social Judgement: Normal  Stress:  Stress Stressors: Illness, Money(constant pain, reduced physical functioning,) Coping Ability: Overwhelmed, Exhausted Patient Takes Medications The Way The Doctor Instructed?: Yes Priority Risk: Moderate Risk  Risk Assessment- Self-Harm Potential: Risk Assessment For Self-Harm Potential Thoughts of Self-Harm: No current thoughts(Patient denies current thoughts but reports having fleeting thoughts of shooting self  about 2 weeks ago, says he wouldn't do  anything due to relationship with mother) Method: No plan Availability of Means: No access/NA Patient reports he has guns but no longer has guns in his home.  He says guns are secured at his mother's house.  She lives next door. Patient agrees to call this practice, call 911 or have someone take him to the ER should symptoms worsen.  Patient has crisis contact information and phone numbers.   Risk Assessment -Dangerous to Others Potential: Risk Assessment For Dangerous to Others Potential Method: No Plan(denies current thoughts but reports having fleeting thoughts of shooting others about 2 weeks ago, reports having a lot of anger and irritability, no person specified, just random people but says he wouldn't do anything due to relationship with mother) Availability of Means: No access or NA Intent: Vague intent or NA Notification Required: No need or identified person Patient reports he has guns but no longer has guns in his home.  He says guns are secured at his mother's house.  She lives next door. Patient agrees to call this practice, call 911 or have someone take him to the ER should symptoms worsen.  Patient has crisis contact information and phone numbers.    DSM5 Diagnoses: Patient Active Problem List   Diagnosis Date Noted  . Alcohol abuse 04/23/2019  . Palpitations 02/11/2019    Patient Centered Plan: Patient is on the following Treatment Plan(s):  Will be developed at next session  Recommendations for Services/Supports/Treatments: Recommendations for Services/Supports/Treatments Recommendations For Services/Supports/Treatments: Individual Therapy, Medication Management /the patient attends the assessment appointment today.  Confidentiality and limits are discussed.  Patient agrees to return for an appointment in 2 weeks.  He also agrees to call this practice, call 911, or have someone take him to the ER should symptoms worsen.  Patient has crisis contact information.   Individual therapy is recommended 1 time every 1 to 2 weeks to alleviate depressive symptoms, to learn and implement cognitive and behavioral strategies to overcome depression.  Treatment Plan Summary: Will be developed at next session  Referrals to Alternative Service(s): Referred to Alternative Service(s):   Place:   Date:   Time:    Referred to Alternative Service(s):   Place:   Date:   Time:    Referred to Alternative Service(s):   Place:   Date:   Time:    Referred to Alternative Service(s):   Place:   Date:   Time:     Alonza Smoker

## 2019-06-25 NOTE — Progress Notes (Deleted)
BH MD/PA/NP OP Progress Note  06/25/2019 3:58 PM Tyrone Nelson  MRN:  [id number]  Chief Complaint:  HPI: *** Visit Diagnosis: No diagnosis found.  Past Psychiatric History: Please see initial evaluation for full details. I have reviewed the history. No updates at this time.     Past Medical History:  Past Medical History:  Diagnosis Date  . Anxiety   . Arthritis   . Depression   . Hypertension     Past Surgical History:  Procedure Laterality Date  . CERVICAL SPINE SURGERY  2017  . SPINE SURGERY  2017   vertebrae putting pressure on spinal cord    Family Psychiatric History: Please see initial evaluation for full details. I have reviewed the history. No updates at this time.     Family History:  Family History  Problem Relation Age of Onset  . Hypertension Mother   . Cancer Brother     Social History:  Social History   Socioeconomic History  . Marital status: Divorced    Spouse name: Not on file  . Number of children: Not on file  . Years of education: Not on file  . Highest education level: Not on file  Occupational History  . Not on file  Social Needs  . Financial resource strain: Not on file  . Food insecurity    Worry: Not on file    Inability: Not on file  . Transportation needs    Medical: Not on file    Non-medical: Not on file  Tobacco Use  . Smoking status: Former Smoker    Packs/day: 0.50    Years: 10.00    Pack years: 5.00    Types: Cigarettes    Quit date: 05/08/2019    Years since quitting: 0.1  . Smokeless tobacco: Never Used  Substance and Sexual Activity  . Alcohol use: Yes    Comment: last used 2 months ago  beer  . Drug use: Not Currently    Types: Marijuana    Comment: last used about 2 months ago  . Sexual activity: Yes    Birth control/protection: Condom  Lifestyle  . Physical activity    Days per week: Not on file    Minutes per session: Not on file  . Stress: Not on file  Relationships  . Social Clinical research associate on phone: Not on file    Gets together: Not on file    Attends religious service: Not on file    Active member of club or organization: Not on file    Attends meetings of clubs or organizations: Not on file    Relationship status: Not on file  Other Topics Concern  . 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      Allergies: No Known Allergies  Metabolic Disorder Labs: No results found for: HGBA1C, MPG No results found for: PROLACTIN Lab Results  Component Value Date   CHOL 219 (H) 07/07/2018   TRIG 54 07/07/2018   HDL 74 07/07/2018   CHOLHDL 3.0 07/07/2018   LDLCALC 134 (H) 07/07/2018   Lab Results  Component Value Date   TSH 1.420 01/16/2019    Therapeutic Level Labs: No results found for: LITHIUM No results found for: VALPROATE No components found for:  CBMZ  Current Medications: Current Outpatient Medications  Medication Sig Dispense Refill  . diltiazem (CARDIZEM CD) 240 MG 24 hr capsule TAKE 1 CAPSULE (240 MG TOTAL) BY MOUTH DAILY. FOR BLOOD PRESSURE  30 capsule 1  . pregabalin (LYRICA) 50 MG capsule 1 qhs X7 days , then 2 qhs X 7d, then 3 qhs X 7d, then 4 qhs 120 capsule 1  . traZODone (DESYREL) 300 MG tablet Take 1 tablet (300 mg total) by mouth at bedtime as needed for sleep. 30 tablet 2  . venlafaxine XR (EFFEXOR XR) 75 MG 24 hr capsule 225 mg daily (75 mg + 150 mg) 30 capsule 0  . venlafaxine XR (EFFEXOR-XR) 150 MG 24 hr capsule 225 mg daily (75 mg + 150 mg) 30 capsule 0   No current facility-administered medications for this visit.      Musculoskeletal: Strength & Muscle Tone: N/A Gait & Station: N/A Patient leans: N/A  Psychiatric Specialty Exam: ROS  There were no vitals taken for this visit.There is no height or weight on file to calculate BMI.  General Appearance: {Appearance:22683}  Eye Contact:  {BHH EYE CONTACT:22684}  Speech:  Clear and Coherent  Volume:  Normal  Mood:  {BHH MOOD:22306}  Affect:  {Affect  (PAA):22687}  Thought Process:  Coherent  Orientation:  Full (Time, Place, and Person)  Thought Content: Logical   Suicidal Thoughts:  {ST/HT (PAA):22692}  Homicidal Thoughts:  {ST/HT (PAA):22692}  Memory:  Immediate;   Good  Judgement:  {Judgement (PAA):22694}  Insight:  {Insight (PAA):22695}  Psychomotor Activity:  Normal  Concentration:  Concentration: Good and Attention Span: Good  Recall:  Good  Fund of Knowledge: Good  Language: Good  Akathisia:  No  Handed:  Right  AIMS (if indicated): not done  Assets:  Communication Skills Desire for Improvement  ADL's:  Intact  Cognition: WNL  Sleep:  {BHH GOOD/FAIR/POOR:22877}   Screenings: GAD-7     Office Visit from [facility]/16/2019 in [facility]  Total GAD-7 Score  21    PHQ2-9     Office Visit from 05/13/2019 in [facility] Office Visit from 01/16/2019 in [facility] Office Visit from 09/10/2018 in [facility] Office Visit from [facility]/2018 in [facility] Office Visit from 07/07/2018 in [facility]  PHQ-2 Total Score  0  4  0  5  5  PHQ-9 Total Score  -  16  -  16  16       Assessment and Plan:  Tyrone Nelson is a 48 y.o. 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  # MDD, severe, recurrent without psychotic features  Patient reports worsening in depressive symptoms since the last visit.  Psychosocial stressors includes physical condition of radiculopathy.  Will uptitrate venlafaxine to target residual mood symptoms.  Discussed potential side effect of hypertension.  He will greatly benefit from CBT/supportive therapy;  will make referral again.  Discussed behavioral activation.   # SI He reports fleeting SI with some plan, although he does not elaborate it. He denies any intent/attempt since the last visit. He agrees to lock guns where he has access to his parents' (who lives next door). Although he declines his parents to be contacted (as he is concerned that they/especially his mother would be worried to much about him), he agrees with contacting emergency resources as needed. Will have sooner   # Binge alcohol use He denies any alcohol  use since the last visit.  Will continue to monitor.   Plan 1. Increase venlafaxine 225 mg daily 2. Referral to therapy 3. Next appointment:8/5 at 10:20 for 30 mins, video - on gabapentin 1500 mg BID   The patient demonstrates the following risk factors for suicide: Chronic risk factors for suicide include:psychiatric disorder ofdepressionand chronic pain. Acute risk factorsfor suicide include: unemployment and loss (financial, interpersonal, professional). Protective factorsfor this patient include: positive social support, responsibility to others (children, family), coping skills. He is amenable to treatment plans, and is future oriented. Considering these factors, the overall suicide risk at this point appears to bemoderate, but not at imminent risk to self. Patientisappropriate for outpatient follow up.    Neysa Hottereina Francisco Ostrovsky, MD 06/25/2019, 3:58 PM

## 2019-06-29 ENCOUNTER — Other Ambulatory Visit: Payer: Self-pay | Admitting: Family Medicine

## 2019-07-01 ENCOUNTER — Telehealth (HOSPITAL_COMMUNITY): Payer: Self-pay | Admitting: Psychiatry

## 2019-07-01 ENCOUNTER — Ambulatory Visit (HOSPITAL_COMMUNITY): Payer: Medicaid Other | Admitting: Psychiatry

## 2019-07-01 ENCOUNTER — Other Ambulatory Visit: Payer: Self-pay

## 2019-07-01 NOTE — Telephone Encounter (Signed)
Sent link for video visit through [employer] [facility]. Patient did not sign in. Called the patient  twice for appointment scheduled today. The patient did not answer the phone. Left voice message to contact the office.  

## 2019-07-02 ENCOUNTER — Other Ambulatory Visit: Payer: Self-pay

## 2019-07-03 ENCOUNTER — Encounter: Payer: Self-pay | Admitting: Family Medicine

## 2019-07-03 ENCOUNTER — Other Ambulatory Visit: Payer: Self-pay

## 2019-07-03 ENCOUNTER — Ambulatory Visit: Payer: Medicaid Other | Admitting: Family Medicine

## 2019-07-03 VITALS — BP 146/101 | HR 81 | Temp 97.5°F | Ht 76.0 in | Wt 219.0 lb

## 2019-07-03 DIAGNOSIS — G629 Polyneuropathy, unspecified: Secondary | ICD-10-CM | POA: Diagnosis not present

## 2019-07-03 DIAGNOSIS — R109 Unspecified abdominal pain: Secondary | ICD-10-CM | POA: Diagnosis not present

## 2019-07-03 DIAGNOSIS — R200 Anesthesia of skin: Secondary | ICD-10-CM | POA: Diagnosis not present

## 2019-07-03 LAB — URINALYSIS
Bilirubin, UA: NEGATIVE
Glucose, UA: NEGATIVE
Ketones, UA: NEGATIVE
Leukocytes,UA: NEGATIVE
Nitrite, UA: NEGATIVE
Protein,UA: NEGATIVE
RBC, UA: NEGATIVE
Specific Gravity, UA: 1.025 (ref 1.005–1.030)
Urobilinogen, Ur: 1 mg/dL (ref 0.2–1.0)
pH, UA: 7.5 (ref 5.0–7.5)

## 2019-07-03 MED ORDER — PREGABALIN 300 MG PO CAPS: 300.0000 mg | ORAL_CAPSULE | Freq: Every day | ORAL | 1 refills | Status: DC

## 2019-07-03 NOTE — Progress Notes (Signed)
Chief Complaint  Patient presents with  . nerve pain    HPI  Patient presents today for continued flank pain. Unrelieved by the lyrica. He states the right flank is numb. The left flank has moderate burning pain that radiates into the left thigh and leg. He is experiencing frequnet nocturia that has an accompanying burning sensation. All of this has been insidious in onset over several months.   PMH: Smoking status noted ROS: Review of Systems  Constitutional: Negative for chills, diaphoresis and fever.  HENT: Negative for congestion and sore throat.   Eyes: Negative.   Respiratory: Negative for cough and shortness of breath.   Cardiovascular: Negative for chest pain and palpitations.  Gastrointestinal: Negative for abdominal pain, diarrhea, nausea and vomiting.  Genitourinary: Positive for dysuria, flank pain and frequency. Negative for hematuria.  Musculoskeletal: Negative for joint pain.  Skin: Negative for rash.  Neurological: Negative for dizziness and headaches.  Psychiatric/Behavioral: Negative.      Objective: BP (!) 146/101   Pulse 81   Temp (!) 97.5 F (36.4 C) (Temporal)   Ht 6\' 4"  (1.93 m)   Wt 219 lb (99.3 kg)   BMI 26.66 kg/m  Gen: NAD, alert, cooperative with exam HEENT: NCAT, EOMI, PERRL CV: RRR, good S1/S2, no murmur Resp: CTABL, no wheezes, non-labored Abd: SNTND, BS present, no guarding or organomegaly Ext: No edema, warm Neuro: Alert and oriented, No gross deficits  Assessment and plan:  1. Flank pain   2. Numbness   3. Neuropathy     Meds ordered this encounter  Medications  . pregabalin (LYRICA) 300 MG capsule    Sig: Take 1 capsule (300 mg total) by mouth daily.    Dispense:  30 capsule    Refill:  1  . solifenacin (VESICARE) 10 MG tablet    Sig: Take 1 tablet (10 mg total) by mouth daily. For bladder control    Dispense:  30 tablet    Refill:  5    Orders Placed This Encounter  Procedures  . Urine Culture  . Urinalysis  .  Ambulatory referral to Neurology    Referral Priority:   Routine    Referral Type:   Consultation    Referral Reason:   Specialty Services Required    Requested Specialty:   Neurology    Number of Visits Requested:   1    Follow up 6 weeks  Claretta Fraise, MD

## 2019-07-04 LAB — URINE CULTURE: Organism ID, Bacteria: NO GROWTH

## 2019-07-09 ENCOUNTER — Encounter: Payer: Self-pay | Admitting: Family Medicine

## 2019-07-09 ENCOUNTER — Telehealth: Payer: Self-pay | Admitting: Family Medicine

## 2019-07-09 DIAGNOSIS — G629 Polyneuropathy, unspecified: Secondary | ICD-10-CM | POA: Insufficient documentation

## 2019-07-09 DIAGNOSIS — R200 Anesthesia of skin: Secondary | ICD-10-CM | POA: Insufficient documentation

## 2019-07-09 DIAGNOSIS — R109 Unspecified abdominal pain: Secondary | ICD-10-CM | POA: Insufficient documentation

## 2019-07-09 MED ORDER — SOLIFENACIN SUCCINATE 10 MG PO TABS: 10.0000 mg | ORAL_TABLET | Freq: Every day | ORAL | 5 refills | Status: DC

## 2019-07-09 NOTE — Telephone Encounter (Signed)
Please review and advise.

## 2019-07-09 NOTE — Telephone Encounter (Signed)
Please contact the patient I sent in vesicare for bladder spasm, not an antibiotic. Hid culture was negative. Vesicare should help both with the pain and with the frequent night time trips to the bathroom.

## 2019-07-09 NOTE — Telephone Encounter (Signed)
Patient notified and verbalized understanding. 

## 2019-07-10 ENCOUNTER — Telehealth: Payer: Self-pay | Admitting: *Deleted

## 2019-07-10 MED ORDER — VESICARE 10 MG PO TABS: 10.0000 mg | ORAL_TABLET | Freq: Every day | ORAL | 5 refills | Status: DC

## 2019-07-10 NOTE — Telephone Encounter (Signed)
Solifenacin Succinate 10mg  (generic for Vesicare)-Non Preferred  Preferred is Brand Name [place] so another rx for Brand Name sent into pharmacy.

## 2019-08-24 ENCOUNTER — Telehealth: Payer: Self-pay | Admitting: Neurology

## 2019-08-24 ENCOUNTER — Encounter: Payer: Self-pay | Admitting: Neurology

## 2019-08-24 ENCOUNTER — Ambulatory Visit: Payer: Medicaid Other | Admitting: Neurology

## 2019-08-24 ENCOUNTER — Other Ambulatory Visit: Payer: Self-pay

## 2019-08-24 VITALS — BP 120/58 | HR 80 | Temp 97.8°F | Ht 76.0 in | Wt 220.0 lb

## 2019-08-24 DIAGNOSIS — M48061 Spinal stenosis, lumbar region without neurogenic claudication: Secondary | ICD-10-CM

## 2019-08-24 DIAGNOSIS — G3281 Cerebellar ataxia in diseases classified elsewhere: Secondary | ICD-10-CM | POA: Diagnosis not present

## 2019-08-24 DIAGNOSIS — R269 Unspecified abnormalities of gait and mobility: Secondary | ICD-10-CM | POA: Diagnosis not present

## 2019-08-24 DIAGNOSIS — G959 Disease of spinal cord, unspecified: Secondary | ICD-10-CM | POA: Diagnosis not present

## 2019-08-24 NOTE — Progress Notes (Signed)
PATIENT: Tyrone Nelson DOB: 04/04/1971  Chief Complaint  Patient presents with  . Referral    Referral for flank pain numbness neuropathy room 4 pt had surgery on neck in 2017 [facility]  with Corrie DandyMary and her temp is 97.7     HISTORICAL  Torrie Mayershillip Rightmyer is a 48 year old male, seen in request by Dr. Mechele ClaudeStacks, [place] for evaluation of worsening gait abnormality, bilateral lower extremity paresthesia, he is with his mother at today's visit on August 24, 2019.  He used to work at [employer] business, heavy lifting, around 2017, he developed severe neck pain, low back pain, radiating pain to bilateral upper extremity, bilateral lower extremity, also gait abnormality, bowel and bladder urgency, occasionally incontinence, bilateral fingertips paresthesia, clumsiness of bilateral hands.  He was evaluated and treated at [facility], MRI of cervical spine February 2017, multilevel advanced cervical spondylosis, most significant at C3-4, there is compression of the spinal cord appears thin, there is cord signal abnormality, also moderate to advanced spinal canal stenosis secondary to disc herniation at C7-T1, varying degree of neural foraminal narrowing,  MRI of thoracic spine: Multilevel degenerative changes, without significant spinal cord stenosis or abnormal cord signal changes, diffuse abnormal bone marrow signal.  MRI of lumbar, multilevel degenerative changes, severe right L3 4, L45, and the left L3-4, L4-5, L5-S1 foraminal stenosis, left eccentric disc herniation contacting the descending nerve roots of S1.  Severe facet degenerative changes at different levels  He underwent C3-T1 left open-door laminoplasty locally have listed autograft and joint decortication for fusion and predominantly the right close the door side C3-T1, rib allograft at C3-4, C5-6, C7-T1 by neurosurgeon Dr. Delila Spenceobert Wolfe on Apr 09, 2016,  Surgery did help his significant neck pain, but he continued to have  gait abnormality, bilateral lower extremity paresthesia from hip down, urinary and bowel urgency, occasionally incontinence  Since 2020, he noticed worsening low back pain, radiating pain to bilateral lower extremity, more dense numbness, increased gait abnormality, using a cane to walk, fell sometimes,  Had a repeat [facility] March [facility], interval development of central disc herniation at L4-5 disc space, and worsening canal stenosis at this level, there is also multilevel spondylosis, with severe foraminal narrowing at L4-5, and disc herniation contacting/compressing left L5 nerve roots, left L3 nerve roots,  MRI of thoracic spine showed multilevel degenerative changes within the thoracic spine, small focus of increased T2 signal within the left aspect of the thoracic cord at T1-T2, likely myelomalacia from previous decompression.   REVIEW OF SYSTEMS: Full 14 system review of systems performed and notable only for as above All other review of systems were negative.  ALLERGIES: No Known Allergies  HOME MEDICATIONS: Current Outpatient Medications  Medication Sig Dispense Refill  . diltiazem (CARDIZEM CD) 240 MG 24 hr capsule TAKE 1 CAPSULE (240 MG TOTAL) BY MOUTH DAILY. FOR BLOOD PRESSURE 30 capsule 0  . pregabalin (LYRICA) 300 MG capsule Take 1 capsule (300 mg total) by mouth daily. 30 capsule 1  . traZODone (DESYREL) 300 MG tablet Take 1 tablet (300 mg total) by mouth at bedtime as needed for sleep. 30 tablet 2   No current facility-administered medications for this visit.     PAST MEDICAL HISTORY: Past Medical History:  Diagnosis Date  . Anxiety   . Arthritis   . Depression   . Hypertension     PAST SURGICAL HISTORY: Past Surgical History:  Procedure Laterality Date  . CERVICAL SPINE SURGERY  2017  . SPINE SURGERY  2017   vertebrae putting pressure on spinal cord    FAMILY HISTORY: Family History  Problem Relation Age of Onset  . Hypertension  Mother   . Cancer Brother     SOCIAL HISTORY: Social History   Socioeconomic History  . Marital status: Divorced    Spouse name: Not on file  . Number of children: Not on file  . Years of education: Not on file  . Highest education level: Not on file  Occupational History  . Not on file  Social Needs  . Financial resource strain: Not on file  . Food insecurity    Worry: Not on file    Inability: Not on file  . Transportation needs    Medical: Not on file    Non-medical: Not on file  Tobacco Use  . Smoking status: Former Smoker    Packs/day: 0.50    Years: 10.00    Pack years: 5.00    Types: Cigarettes    Quit date: 05/08/2019    Years since quitting: 0.2  . Smokeless tobacco: Never Used  Substance and Sexual Activity  . Alcohol use: Yes    Comment: last used 2 months ago  beer  . Drug use: Not Currently    Types: Marijuana    Comment: last used about 2 months ago  . Sexual activity: Yes    Birth control/protection: Condom  Lifestyle  . Physical activity    Days per week: Not on file    Minutes per session: Not on file  . Stress: Not on file  Relationships  . Social Musician on phone: Not on file    Gets together: Not on file    Attends religious service: Not on file    Active member of club or organization: Not on file    Attends meetings of clubs or organizations: Not on file    Relationship status: Not on file  . Intimate partner violence    Fear of current or ex partner: Not on file    Emotionally abused: Not on file    Physically abused: Not on file    Forced sexual activity: Not on file  Other Topics Concern  . 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       PHYSICAL EXAM   Vitals:   08/24/19 1406  BP: (!) 120/58  Pulse: 80  Temp: 97.8 F (36.6 C)  Weight: 220 lb (99.8 kg)  Height: 6\' 4"  (1.93 m)    Not recorded      Body mass index is 26.78 kg/m.  PHYSICAL EXAMNIATION:  Gen: NAD,  conversant, well nourised, well groomed                     Cardiovascular: Regular rate rhythm, no peripheral edema, warm, nontender. Eyes: Conjunctivae clear without exudates or hemorrhage Neck: Supple, no carotid bruits. Pulmonary: Clear to auscultation bilaterally   NEUROLOGICAL EXAM:  MENTAL STATUS: Speech:    Speech is normal; fluent and spontaneous with normal comprehension.  Cognition:     Orientation to time, place and person     Normal recent and remote memory     Normal Attention span and concentration     Normal Language, naming, repeating,spontaneous speech     Fund of knowledge   CRANIAL NERVES: CN II: Visual fields are full to confrontation.  Pupils are round equal and briskly reactive  to light. CN III, IV, VI: extraocular movement are normal. No ptosis. CN V: Facial sensation is intact to pinprick in all 3 divisions bilaterally. Corneal responses are intact.  CN VII: Face is symmetric with normal eye closure and smile. CN VIII: Hearing is normal to causal conversation. CN IX, X: Palate elevates symmetrically. Phonation is normal. CN XI: Head turning and shoulder shrug are intact CN XII: Tongue is midline with normal movements and no atrophy.  MOTOR: He has mild bilateral shoulder abduction, external rotation, elbow flexion weakness, mild to moderate bilateral wrist flexion, extension, finger abduction weakness.  Also has mild spasticity of bilateral lower extremity, mild bilateral hip flexion, toe flexion extension weakness.  REFLEXES: Reflexes are absent at the biceps, 1/1 triceps, 3/3 knees, and 2/2 ankles. Plantar responses are extensor bilaterally  SENSORY: Length dependent decreased light touch, pinprick, vibratory sensation to knee levels  COORDINATION: Rapid alternating movements and fine finger movements are intact. There is no dysmetria on finger-to-nose and heel-knee-shin.    GAIT/STANCE: Need push-up to get up from seated position, spastic unsteady  stiff gait, rely on his cane DIAGNOSTIC DATA (LABS, IMAGING, TESTING) - I reviewed patient records, labs, notes, testing and imaging myself where available.   ASSESSMENT AND PLAN  Antonious Omahoney is a 48 y.o. male   History of severe cervical stenosis, status post a C3-T1 decompression in May 2017  Residual spastic quadriplegia,  Worsening low back pain radiating pain to bilateral lower extremity  Known history of lumbar stenosis  Repeat MRI of lumbar, thoracic spine  EMG nerve conduction study    Marcial Pacas, M.D. Ph.D.  [facility] [address], [place], [place] [address] Ph: [phone] Fax: [phone]  CC: Referring Provider

## 2019-08-24 NOTE — Telephone Encounter (Signed)
Medicaid order sent to GI. They will reach out to the patient to schedule.  

## 2019-09-12 ENCOUNTER — Other Ambulatory Visit: Payer: Self-pay

## 2019-10-03 ENCOUNTER — Ambulatory Visit
Admission: RE | Admit: 2019-10-03 | Discharge: 2019-10-03 | Disposition: A | Discharge status: Home / Self Care | Payer: Medicaid Other | Source: Ambulatory Visit | Attending: Neurology | Admitting: Neurology

## 2019-10-03 ENCOUNTER — Other Ambulatory Visit: Payer: Self-pay

## 2019-10-03 DIAGNOSIS — G3281 Cerebellar ataxia in diseases classified elsewhere: Secondary | ICD-10-CM

## 2019-10-06 ENCOUNTER — Telehealth: Payer: Self-pay | Admitting: Neurology

## 2019-10-06 NOTE — Telephone Encounter (Addendum)
I called the patient, the MRI of the lumbar spine shows a moderate level spinal stenosis at the L4-5 level, potential for nerve root compression at this level at the L4 and L5 nerve roots.  MRI of the thoracic spine did not show any spinal cord compression, not clear that these studies fully explain a gait instability problem.   MRI lumbar 10/05/19:  IMPRESSION: This MRI of the lumbar spine without contrast shows the following: 1.   At L2-L3, there is mild spinal stenosis and moderate left foraminal narrowing but no nerve root compression. 2.   At L3-L4, there are degenerative changes causing moderate bilateral foraminal narrowing the lateral recess stenosis but no definite nerve  3.   At L4-L5, there is moderately severe spinal stenosis due to degenerative changes causing moderately severe bilateral foraminal narrowing and right lateral recess stenosis with potential for bilateral L4 and right L5 nerve root compression. 4.   At T12-L1, L1-L2 and L5-S1, there are degenerative changes but no spinal stenosis or nerve root compression.   MRI thoracic 10/05/19:  IMPRESSION: This MRI of the thoracic spine without contrast shows the following: 1.   The spinal cord appears normal. 2.   There is no spinal stenosis. 3.   Multilevel disc bulges and protrusions as detailed above that do not lead to nerve root compression.

## 2019-10-12 NOTE — Telephone Encounter (Signed)
EMG/NCS on Oct 14 2019. Will make decision about neurosurgeon refer after EMG

## 2019-10-14 ENCOUNTER — Ambulatory Visit (INDEPENDENT_AMBULATORY_CARE_PROVIDER_SITE_OTHER): Payer: Medicaid Other | Admitting: Neurology

## 2019-10-14 ENCOUNTER — Other Ambulatory Visit: Payer: Self-pay

## 2019-10-14 ENCOUNTER — Ambulatory Visit: Payer: Medicaid Other | Admitting: Neurology

## 2019-10-14 DIAGNOSIS — G3281 Cerebellar ataxia in diseases classified elsewhere: Secondary | ICD-10-CM

## 2019-10-14 DIAGNOSIS — R269 Unspecified abnormalities of gait and mobility: Secondary | ICD-10-CM

## 2019-10-14 DIAGNOSIS — M48061 Spinal stenosis, lumbar region without neurogenic claudication: Secondary | ICD-10-CM

## 2019-10-14 DIAGNOSIS — G959 Disease of spinal cord, unspecified: Secondary | ICD-10-CM

## 2019-10-14 NOTE — Procedures (Signed)
Full Name: Tyrone Nelson Gender: Male MRN #: [id number] Date of Birth: 1970-12-11    Visit Date: 10/14/2019 10:24 Age: 48 Years 1 Months Old Examining Physician: Levert Feinstein, MD  Referring Physician: Levert Feinstein, MD History: 48 years old male, with history of cervical decompression surgery, continues to complain of bilateral lower extremity paresthesia, worsening low back pain, occasionally radiating pain to right hip  Summary of the tests:  Nerve conduction study: Bilateral sural, right superficial peroneal sensory responses were normal.  Left tibial, right peroneal to EDB motor responses were normal.  Right tibial motor response showed mildly decreased CMAP amplitude, with mildly prolonged F-wave latency  Electromyography: Selected needle examinations were performed at bilateral lower extremity muscles and bilateral lumbosacral paraspinal muscles.  There was evidence of chronic neuropathic changes involving bilateral tibialis anterior, tibialis posterior, medial gastrocnemius.  There was no spontaneous activity at bilateral lumbosacral paraspinal muscles.   Conclusion: This is an abnormal study.  There is electrodiagnostic evidence of chronic bilateral L5-S1 radiculopathy.  There is no evidence of active process.    ------------------------------- Levert Feinstein, M.D. PhD  [facility] [address] [place], [place] [address] Tel: [phone] Fax: [phone]        [facility]    Nerve / Sites Muscle Latency Ref. Amplitude Ref. Rel Amp Segments Distance Velocity Ref. Area    ms ms mV mV %  cm m/s m/s mVms  R Peroneal - EDB     Ankle EDB 5.2 ?6.5 5.4 ?2.0 100 Ankle - EDB 9   15.5     Fib head EDB 12.7  4.5  84.6 Fib head - Ankle 33 44 ?44 15.7     Pop fossa EDB 14.9  4.3  95.7 Pop fossa - Fib head 10 44 ?44 15.6         Pop fossa - Ankle      R Tibial - AH     Ankle AH 4.1 ?5.8 3.4 ?4.0 100 Ankle - AH 9   12.4     Pop fossa AH 14.5  2.1  60.5 Pop fossa -  Ankle 44 42 ?41 13.2  L Tibial - AH     Ankle AH 4.8 ?5.8 5.9 ?4.0 100 Ankle - AH 9   13.4     Pop fossa AH 15.9  5.9  101 Pop fossa - Ankle 45 41 ?41 13.9           SNC    Nerve / Sites Rec. Site Peak Lat Ref.  Amp Ref. Segments Distance    ms ms V V  cm  R Sural - Ankle (Calf)     Calf Ankle 3.9 ?4.4 12 ?6 Calf - Ankle 14  L Sural - Ankle (Calf)     Calf Ankle 4.2 ?4.4 10 ?6 Calf - Ankle 14  R Superficial peroneal - Ankle     Lat leg Ankle 4.2 ?4.4 6 ?6 Lat leg - Ankle 14           F  Wave    Nerve F Lat Ref.   ms ms  R Tibial - AH 64.1 ?56.0  L Tibial - AH 57.3 ?56.0         EMG       EMG Summary Table    Spontaneous MUAP Recruitment  Muscle IA Fib PSW Fasc Other Amp Dur. Poly Pattern  R. Tibialis anterior Normal None None None _______ Normal Normal Normal Reduced  R.  Tibialis posterior Normal None None None _______ Normal Normal Normal Reduced  R. Peroneus longus Normal None None None _______ Normal Normal Normal Reduced  R. Gastrocnemius (Medial head) Normal None None None _______ Normal Normal Normal Reduced  R. Vastus lateralis Normal None None None _______ Normal Normal Normal Normal  L. Tibialis anterior Normal None None None _______ Normal Normal Normal Reduced  L. Tibialis posterior Normal None None None _______ Normal Normal Normal Reduced  L. Peroneus longus Normal None None None _______ Normal Normal Normal Reduced  L. Vastus lateralis Normal None None None _______ Normal Normal Normal Normal  L. Lumbar paraspinals (mid) Normal None None None _______ Normal Normal Normal Normal  L. Lumbar paraspinals (low) Normal None None None _______ Normal Normal Normal Normal  R. Lumbar paraspinals (low) Normal None None None _______ Normal Normal Normal Normal  R. Lumbar paraspinals (mid) Normal None None None _______ Normal Normal Normal Normal

## 2019-10-14 NOTE — Progress Notes (Signed)
PATIENT: Tyrone Nelson DOB: 10-Apr-1971  No chief complaint on file.    HISTORICAL  Tyrone Nelson is a 48 year old male, seen in request by Dr. Mechele ClaudeStacks, [place] for evaluation of worsening gait abnormality, bilateral lower extremity paresthesia, he is with his mother at today's visit on August 24, 2019.  He used to work at [employer] business, heavy lifting, around 2017, he developed severe neck pain, low back pain, radiating pain to bilateral upper extremity, bilateral lower extremity, also gait abnormality, bowel and bladder urgency, occasionally incontinence, bilateral fingertips paresthesia, clumsiness of bilateral hands.  He was evaluated and treated at [facility], MRI of cervical spine February 2017, multilevel advanced cervical spondylosis, most significant at C3-4, there is compression of the spinal cord appears thin, there is cord signal abnormality, also moderate to advanced spinal canal stenosis secondary to disc herniation at C7-T1, varying degree of neural foraminal narrowing,  MRI of thoracic spine: Multilevel degenerative changes, without significant spinal cord stenosis or abnormal cord signal changes, diffuse abnormal bone marrow signal.  MRI of lumbar, multilevel degenerative changes, severe right L3 4, L45, and the left L3-4, L4-5, L5-S1 foraminal stenosis, left eccentric disc herniation contacting the descending nerve roots of S1.  Severe facet degenerative changes at different levels  He underwent C3-T1 left open-door laminoplasty locally have listed autograft and joint decortication for fusion and predominantly the right close the door side C3-T1, rib allograft at C3-4, C5-6, C7-T1 by neurosurgeon Dr. Delila Spenceobert Wolfe on Apr 09, 2016,  Surgery did help his significant neck pain, but he continued to have gait abnormality, bilateral lower extremity paresthesia from hip down, urinary and bowel urgency, occasionally incontinence  Since 2020, he noticed worsening low back  pain, radiating pain to bilateral lower extremity, more dense numbness, increased gait abnormality, using a cane to walk, fell sometimes,  Had a [facility], interval development of central disc herniation at L4-5 disc space, and worsening canal stenosis at this level, there is also multilevel spondylosis, with severe foraminal narrowing at L4-5, and disc herniation contacting/compressing left L5 nerve roots, left L3 nerve roots,  MRI of thoracic spine showed multilevel degenerative changes within the thoracic spine, small focus of increased T2 signal within the left aspect of the thoracic cord at T1-T2, likely myelomalacia from previous decompression.  UPDATE Oct 14 2019: He return for electrodiagnostic study today, which showed chronic bilateral L5-S1 radiculopathy, Lyrica 300 mg daily provide some help.  He continue complains of gait abnormality, numbness from knee down, worsening low back pain, sometimes 10 out of 10, radiating pain to right hip  I personally reviewed MRI of lumbar in November 2020, moderately severe spinal stenosis at L4-5, causing moderate severe bilateral foraminal narrowing, right lateral recess stenosis  MRI of thoracic spine, multilevel degenerative changes, no evidence of canal stenosis.  REVIEW OF SYSTEMS: Full 14 system review of systems performed and notable only for as above All other review of systems were negative.  ALLERGIES: No Known Allergies  HOME MEDICATIONS: Current Outpatient Medications  Medication Sig Dispense Refill  . diltiazem (CARDIZEM CD) 240 MG 24 hr capsule TAKE 1 CAPSULE (240 MG TOTAL) BY MOUTH DAILY. FOR BLOOD PRESSURE 30 capsule 0  . pregabalin (LYRICA) 300 MG capsule Take 1 capsule (300 mg total) by mouth daily. 30 capsule 1  . traZODone (DESYREL) 300 MG tablet Take 1 tablet (300 mg total) by mouth at bedtime as needed for sleep. 30 tablet 2   No current  facility-administered medications for this  visit.     PAST MEDICAL HISTORY: Past Medical History:  Diagnosis Date  . Anxiety   . Arthritis   . Depression   . Hypertension     PAST SURGICAL HISTORY: Past Surgical History:  Procedure Laterality Date  . CERVICAL SPINE SURGERY  2017  . SPINE SURGERY  2017   vertebrae putting pressure on spinal cord    FAMILY HISTORY: Family History  Problem Relation Age of Onset  . Hypertension Mother   . Cancer Brother     SOCIAL HISTORY: Social History   Socioeconomic History  . Marital status: Divorced    Spouse name: Not on file  . Number of children: Not on file  . Years of education: Not on file  . Highest education level: Not on file  Occupational History  . Not on file  Social Needs  . Financial resource strain: Not on file  . Food insecurity    Worry: Not on file    Inability: Not on file  . Transportation needs    Medical: Not on file    Non-medical: Not on file  Tobacco Use  . Smoking status: Former Smoker    Packs/day: 0.50    Years: 10.00    Pack years: 5.00    Types: Cigarettes    Quit date: 05/08/2019    Years since quitting: 0.4  . Smokeless tobacco: Never Used  Substance and Sexual Activity  . Alcohol use: Yes    Comment: last used 2 months ago  beer  . Drug use: Not Currently    Types: Marijuana    Comment: last used about 2 months ago  . Sexual activity: Yes    Birth control/protection: Condom  Lifestyle  . Physical activity    Days per week: Not on file    Minutes per session: Not on file  . Stress: Not on file  Relationships  . Social Herbalist on phone: Not on file    Gets together: Not on file    Attends religious service: Not on file    Active member of club or organization: Not on file    Attends meetings of clubs or organizations: Not on file    Relationship status: Not on file  . Intimate partner violence    Fear of current or ex partner: Not on file    Emotionally abused: Not on file    Physically abused: Not on  file    Forced sexual activity: Not on file  Other Topics Concern  . 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       PHYSICAL EXAM   There were no vitals filed for this visit.  Not recorded      There is no height or weight on file to calculate BMI.  PHYSICAL EXAMNIATION:  Gen: NAD, conversant, well nourised, well groomed                     Cardiovascular: Regular rate rhythm, no peripheral edema, warm, nontender. Eyes: Conjunctivae clear without exudates or hemorrhage Neck: Supple, no carotid bruits. Pulmonary: Clear to auscultation bilaterally   NEUROLOGICAL EXAM:  MENTAL STATUS: Speech:    Speech is normal; fluent and spontaneous with normal comprehension.  Cognition:     Orientation to time, place and person     Normal recent and remote memory     Normal Attention span and concentration  Normal Language, naming, repeating,spontaneous speech     Fund of knowledge   CRANIAL NERVES: CN II: Visual fields are full to confrontation.  Pupils are round equal and briskly reactive to light. CN III, IV, VI: extraocular movement are normal. No ptosis. CN V: Facial sensation is intact to pinprick in all 3 divisions bilaterally. Corneal responses are intact.  CN VII: Face is symmetric with normal eye closure and smile. CN VIII: Hearing is normal to causal conversation.   MOTOR: He has mild bilateral shoulder abduction, external rotation, elbow flexion weakness, mild bilateral wrist flexion, extension, finger abduction weakness.  Also has mild spasticity of bilateral lower extremity, mild bilateral hip flexion, toe extension/flexion weakness.  REFLEXES: Reflexes are absent at the biceps, 1/1 triceps, 3/3 knees, and absent at ankles. Plantar responses are extensor bilaterally  SENSORY: Length dependent decreased light touch, pinprick, vibratory sensation to mid shin level  COORDINATION: Rapid alternating movements and fine finger movements  are intact. There is no dysmetria on finger-to-nose and heel-knee-shin.    GAIT/STANCE: Need push-up to get up from seated position, spastic unsteady stiff gait, rely on his cane  DIAGNOSTIC DATA (LABS, IMAGING, TESTING) - I reviewed patient records, labs, notes, testing and imaging myself where available.   ASSESSMENT AND PLAN  Tyrone Nelson is a 48 y.o. male   History of severe cervical stenosis, status post a C3-T1 decompression in May 2017  Residual spastic quadriplegia,  Worsening low back pain radiating pain to bilateral lower extremity  MRI of lumbar showed moderately severe spinal stenosis at L4-5, due to degenerative changes,  EMG nerve conduction study confirmed chronic bilateral L5 S1 radiculopathy,  Will refer him to neurosurgeon for potential decompression of lumbar stenosis   Levert Feinstein, M.D. Ph.D.  [facility] [address], [address] [place], [place] [address] Ph: [phone] Fax: [phone]  CC: Referring Provider

## 2019-10-21 ENCOUNTER — Telehealth: Payer: Self-pay | Admitting: Family Medicine

## 2019-10-21 NOTE — Telephone Encounter (Signed)
Patient is aware to call [facility]

## 2019-10-23 DIAGNOSIS — M4712 Other spondylosis with myelopathy, cervical region: Secondary | ICD-10-CM | POA: Diagnosis not present

## 2019-10-23 DIAGNOSIS — M4012 Other secondary kyphosis, cervical region: Secondary | ICD-10-CM | POA: Diagnosis not present

## 2019-10-23 DIAGNOSIS — I10 Essential (primary) hypertension: Secondary | ICD-10-CM | POA: Diagnosis not present

## 2019-10-23 DIAGNOSIS — M48062 Spinal stenosis, lumbar region with neurogenic claudication: Secondary | ICD-10-CM | POA: Diagnosis not present

## 2019-10-31 ENCOUNTER — Other Ambulatory Visit: Payer: Self-pay | Admitting: Family Medicine

## 2019-11-11 DIAGNOSIS — M542 Cervicalgia: Secondary | ICD-10-CM | POA: Diagnosis not present

## 2019-11-11 DIAGNOSIS — M4712 Other spondylosis with myelopathy, cervical region: Secondary | ICD-10-CM | POA: Diagnosis not present

## 2019-11-20 DIAGNOSIS — M48062 Spinal stenosis, lumbar region with neurogenic claudication: Secondary | ICD-10-CM | POA: Diagnosis not present

## 2019-11-24 ENCOUNTER — Other Ambulatory Visit: Payer: Self-pay | Admitting: Neurosurgery

## 2019-11-30 NOTE — Progress Notes (Signed)
[id number] [place], [place] [address] [place] [address] Phone: [phone] Fax: [phone]      Your procedure is scheduled on Thursday Dec 03, 2019.  Report to [facility] Main Entrance "A" at 0530 A.M., and check in at the Admitting office.  Call this number if you have problems the morning of surgery:  [phone]   Call [phone] if you have any questions prior to your surgery date Monday-Friday 8am-4pm    Remember:  Do not eat or drink after midnight the night before your surgery   Take these medicines the morning of surgery with A SIP OF WATER:  diltiazem (CARDIZEM CD)   7 days prior to surgery STOP taking any Aspirin (unless otherwise instructed by your surgeon),  Aleve, Naproxen, Ibuprofen, Motrin, Advil, Goody's, BC's, all herbal medications, fish oil, and all  vitamins.    The Morning of Surgery  Do not wear jewelry.  Do not wear lotions, powders, colognes, or deodorant  Men may shave face and neck.  Do not bring valuables to the hospital.  [facility] is not responsible for any belongings or valuables.  If you are a smoker, DO NOT Smoke 24 hours prior to surgery  If you wear a CPAP at night please bring your mask the morning of surgery   Remember that you must have someone to transport you home after your surgery, and remain with you for 24 hours if you are discharged the same day.   Please bring cases for contacts, glasses, hearing aids, dentures or bridgework because it cannot be worn into surgery.    Leave your suitcase in the car.  After surgery it may be brought to your room.  For patients admitted to the hospital, discharge time will be determined by your treatment team.  Patients discharged the day of surgery will not be allowed to drive home.    Special instructions:   [facility]- Preparing For Surgery  Before surgery, you can play an important role. Because skin is not sterile, your skin  needs to be as free of germs as possible. You can reduce the number of germs on your skin by washing with CHG (chlorahexidine gluconate) Soap before surgery.  CHG is an antiseptic cleaner which kills germs and bonds with the skin to continue killing germs even after washing.    Oral Hygiene is also important to reduce your risk of infection.  Remember - BRUSH YOUR TEETH THE MORNING OF SURGERY WITH YOUR REGULAR TOOTHPASTE  Please do not use if you have an allergy to CHG or antibacterial soaps. If your skin becomes reddened/irritated stop using the CHG.  Do not shave (including legs and underarms) for at least 48 hours prior to first CHG shower. It is OK to shave your face.  Please follow these instructions carefully.   1. Shower the NIGHT BEFORE SURGERY and the MORNING OF SURGERY with CHG Soap.   2. If you chose to wash your hair, wash your hair first as usual with your normal shampoo.  3. After you shampoo, rinse your hair and body thoroughly to remove the shampoo.  4. Use CHG as you would any other liquid soap. You can apply CHG directly to the skin and wash gently with a scrungie or a clean washcloth.   5. Apply the CHG Soap to your body ONLY FROM THE NECK DOWN.  Do not use on open wounds or open sores. Avoid contact with your eyes, ears,  mouth and genitals (private parts). Wash Face and genitals (private parts)  with your normal soap.   6. Wash thoroughly, paying special attention to the area where your surgery will be performed.  7. Thoroughly rinse your body with warm water from the neck down.  8. DO NOT shower/wash with your normal soap after using and rinsing off the CHG Soap.  9. Pat yourself dry with a CLEAN TOWEL.  10. Wear CLEAN PAJAMAS to bed the night before surgery, wear comfortable clothes the morning of surgery  11. Place CLEAN SHEETS on your bed the night of your first shower and DO NOT SLEEP WITH PETS.    Day of Surgery:  Please shower the morning of surgery with  the CHG soap Do not apply any deodorants/lotions. Please wear clean clothes to the hospital/surgery center.   Remember to brush your teeth WITH YOUR REGULAR TOOTHPASTE.   Please read over the following fact sheets that you were given.

## 2019-12-01 ENCOUNTER — Inpatient Hospital Stay (HOSPITAL_COMMUNITY)
Admission: RE | Admit: 2019-12-01 | Discharge: 2019-12-01 | Disposition: A | Discharge status: Home / Self Care | Payer: Medicaid Other | Source: Ambulatory Visit

## 2019-12-01 ENCOUNTER — Inpatient Hospital Stay (HOSPITAL_COMMUNITY): Admission: RE | Admit: 2019-12-01 | Payer: Medicaid Other | Source: Ambulatory Visit

## 2019-12-03 ENCOUNTER — Ambulatory Visit (HOSPITAL_COMMUNITY): Admission: RE | Admit: 2019-12-03 | Payer: Medicaid Other | Source: Home / Self Care | Admitting: Neurosurgery

## 2019-12-03 ENCOUNTER — Encounter (HOSPITAL_COMMUNITY): Admission: RE | Payer: Self-pay | Source: Home / Self Care

## 2019-12-03 SURGERY — LUMBAR LAMINECTOMY/DECOMPRESSION MICRODISCECTOMY 1 LEVEL
Anesthesia: General

## 2019-12-09 NOTE — Progress Notes (Signed)
I reviewed [facility] evaluation by Dr. Tressie Stalker on November 20, 2019, MRI of lumbar spine showed stenosis, treatment option of L4-5 laminectomy was discussed, patient will think over, follow-up visit in few months was planned

## 2019-12-17 ENCOUNTER — Other Ambulatory Visit: Payer: Self-pay | Admitting: Neurosurgery

## 2019-12-18 ENCOUNTER — Other Ambulatory Visit: Payer: Self-pay | Admitting: Neurosurgery

## 2019-12-31 ENCOUNTER — Other Ambulatory Visit (HOSPITAL_COMMUNITY): Payer: Medicaid Other

## 2019-12-31 NOTE — Pre-Procedure Instructions (Signed)
[id number] - [place], [place] [address] [place] [address] Phone: [phone] Fax: [phone]     Your procedure is scheduled on Monday February 22nd.  Report to [facility] Main Entrance "A" at 5:30 A.M., and check in at the Admitting office.  Call this number if you have problems the morning of surgery:  [phone]  Call [phone] if you have any questions prior to your surgery date Monday-Friday 8am-4pm    Remember:  Do not eat or drink after midnight the night before your surgery   Take these medicines the morning of surgery with A SIP OF WATER  diltiazem (CARDIZEM CD)   7 days prior to surgery STOP taking any Aspirin (unless otherwise instructed by your surgeon), Aleve, Naproxen, Ibuprofen, Motrin, Advil, Goody's, BC's, all herbal medications, fish oil, and all vitamins.    The Morning of Surgery  Do not wear jewelry, make-up or nail polish.  Do not wear lotions, powders, or perfumes/colognes, or deodorant  Do not shave 48 hours prior to surgery.  Men may shave face and neck.  Do not bring valuables to the hospital.  [facility] is not responsible for any belongings or valuables.  If you are a smoker, DO NOT Smoke 24 hours prior to surgery  If you wear a CPAP at night please bring your mask the morning of surgery   Remember that you must have someone to transport you home after your surgery, and remain with you for 24 hours if you are discharged the same day.   Please bring cases for contacts, glasses, hearing aids, dentures or bridgework because it cannot be worn into surgery.    Leave your suitcase in the car.  After surgery it may be brought to your room.  For patients admitted to the hospital, discharge time will be determined by your treatment team.  Patients discharged the day of surgery will not be allowed to drive home.    Special instructions:   [facility]- Preparing For Surgery  Before surgery, you  can play an important role. Because skin is not sterile, your skin needs to be as free of germs as possible. You can reduce the number of germs on your skin by washing with CHG (chlorahexidine gluconate) Soap before surgery.  CHG is an antiseptic cleaner which kills germs and bonds with the skin to continue killing germs even after washing.    Oral Hygiene is also important to reduce your risk of infection.  Remember - BRUSH YOUR TEETH THE MORNING OF SURGERY WITH YOUR REGULAR TOOTHPASTE  Please do not use if you have an allergy to CHG or antibacterial soaps. If your skin becomes reddened/irritated stop using the CHG.  Do not shave (including legs and underarms) for at least 48 hours prior to first CHG shower. It is OK to shave your face.  Please follow these instructions carefully.   1. Shower the NIGHT BEFORE SURGERY and the MORNING OF SURGERY with CHG Soap.   2. If you chose to wash your hair, wash your hair first as usual with your normal shampoo.  3. After you shampoo, rinse your hair and body thoroughly to remove the shampoo.  4. Use CHG as you would any other liquid soap. You can apply CHG directly to the skin and wash gently with a scrungie or a clean washcloth.   5. Apply the CHG Soap to your body ONLY FROM THE NECK DOWN.  Do not use on open wounds  or open sores. Avoid contact with your eyes, ears, mouth and genitals (private parts). Wash Face and genitals (private parts)  with your normal soap.   6. Wash thoroughly, paying special attention to the area where your surgery will be performed.  7. Thoroughly rinse your body with warm water from the neck down.  8. DO NOT shower/wash with your normal soap after using and rinsing off the CHG Soap.  9. Pat yourself dry with a CLEAN TOWEL.  10. Wear CLEAN PAJAMAS to bed the night before surgery, wear comfortable clothes the morning of surgery  11. Place CLEAN SHEETS on your bed the night of your first shower and DO NOT SLEEP WITH  PETS.    Day of Surgery:  Please shower the morning of surgery with the CHG soap Do not apply any deodorants/lotions. Please wear clean clothes to the hospital/surgery center.   Remember to brush your teeth WITH YOUR REGULAR TOOTHPASTE.   Please read over the following fact sheets that you were given.

## 2020-01-01 ENCOUNTER — Encounter (HOSPITAL_COMMUNITY)
Admission: RE | Admit: 2020-01-01 | Discharge: 2020-01-01 | Disposition: A | Discharge status: Home / Self Care | Payer: Medicaid Other | Source: Ambulatory Visit | Attending: Neurosurgery | Admitting: Neurosurgery

## 2020-01-01 ENCOUNTER — Encounter (HOSPITAL_COMMUNITY): Payer: Self-pay

## 2020-01-01 ENCOUNTER — Other Ambulatory Visit (HOSPITAL_COMMUNITY)
Admission: RE | Admit: 2020-01-01 | Discharge: 2020-01-01 | Disposition: A | Discharge status: Home / Self Care | Payer: Medicaid Other | Source: Ambulatory Visit | Attending: Neurosurgery | Admitting: Neurosurgery

## 2020-01-01 ENCOUNTER — Other Ambulatory Visit: Payer: Self-pay

## 2020-01-01 DIAGNOSIS — Z01818 Encounter for other preprocedural examination: Secondary | ICD-10-CM | POA: Diagnosis not present

## 2020-01-01 DIAGNOSIS — Z20822 Contact with and (suspected) exposure to covid-19: Secondary | ICD-10-CM | POA: Insufficient documentation

## 2020-01-01 DIAGNOSIS — I10 Essential (primary) hypertension: Secondary | ICD-10-CM | POA: Diagnosis not present

## 2020-01-01 HISTORY — DX: Spinal stenosis, lumbar region with neurogenic claudication: M48.062

## 2020-01-01 LAB — BASIC METABOLIC PANEL
Anion gap: 9 (ref 5–15)
BUN: 13 mg/dL (ref 6–20)
CO2: 26 mmol/L (ref 22–32)
Calcium: 9.4 mg/dL (ref 8.9–10.3)
Chloride: 106 mmol/L (ref 98–111)
Creatinine, Ser: 1.26 mg/dL — ABNORMAL HIGH (ref 0.61–1.24)
GFR calc Af Amer: >60 mL/min (ref >60)
GFR calc non Af Amer: >60 mL/min (ref >60)
Glucose, Bld: 87 mg/dL (ref 70–99)
Potassium: 4.7 mmol/L (ref 3.5–5.1)
Sodium: 141 mmol/L (ref 135–145)

## 2020-01-01 LAB — CBC
HCT: 44 % (ref 39.0–52.0)
Hemoglobin: 14.6 g/dL (ref 13.0–17.0)
MCH: 32 pg (ref 26.0–34.0)
MCHC: 33.2 g/dL (ref 30.0–36.0)
MCV: 96.5 fL (ref 80.0–100.0)
Platelets: 197 10*3/uL (ref 150–400)
RBC: 4.56 MIL/uL (ref 4.22–5.81)
RDW: 12.3 % (ref 11.5–15.5)
WBC: 5.4 10*3/uL (ref 4.0–10.5)
nRBC: 0 % (ref 0.0–0.2)

## 2020-01-01 LAB — SURGICAL PCR SCREEN
MRSA, PCR: NEGATIVE
Staphylococcus aureus: NEGATIVE

## 2020-01-01 LAB — SARS CORONAVIRUS 2 (TAT 6-24 HRS): SARS Coronavirus 2: NEGATIVE

## 2020-01-01 NOTE — Progress Notes (Signed)
Pt denies SOB, chest pain, and being under the care of a cardiologist. Pt stated that PCP is Dr. Mechele Claude. Pt denies having a stress test, echo and cardiac cath. Pt denies having an EKG and chest x ray in the last year. Pt denies recent labs. Pt reminded to quarantine. Pt verbalized understanding of all pre-op instructions.

## 2020-01-04 ENCOUNTER — Ambulatory Visit (HOSPITAL_COMMUNITY): Payer: Medicaid Other

## 2020-01-04 ENCOUNTER — Encounter (HOSPITAL_COMMUNITY)
Admission: RE | Disposition: A | Discharge status: Home / Self Care | Payer: Self-pay | Source: Home / Self Care | Attending: Neurosurgery

## 2020-01-04 ENCOUNTER — Encounter (HOSPITAL_COMMUNITY): Payer: Self-pay | Admitting: Neurosurgery

## 2020-01-04 ENCOUNTER — Ambulatory Visit (HOSPITAL_COMMUNITY)
Admission: RE | Admit: 2020-01-04 | Discharge: 2020-01-04 | Disposition: A | Discharge status: Home / Self Care | Payer: Medicaid Other | Attending: Neurosurgery | Admitting: Neurosurgery

## 2020-01-04 ENCOUNTER — Other Ambulatory Visit: Payer: Self-pay

## 2020-01-04 DIAGNOSIS — F418 Other specified anxiety disorders: Secondary | ICD-10-CM | POA: Diagnosis not present

## 2020-01-04 DIAGNOSIS — I10 Essential (primary) hypertension: Secondary | ICD-10-CM | POA: Insufficient documentation

## 2020-01-04 DIAGNOSIS — M549 Dorsalgia, unspecified: Secondary | ICD-10-CM | POA: Diagnosis present

## 2020-01-04 DIAGNOSIS — Z87891 Personal history of nicotine dependence: Secondary | ICD-10-CM | POA: Diagnosis not present

## 2020-01-04 DIAGNOSIS — M5416 Radiculopathy, lumbar region: Secondary | ICD-10-CM | POA: Diagnosis not present

## 2020-01-04 DIAGNOSIS — M4726 Other spondylosis with radiculopathy, lumbar region: Secondary | ICD-10-CM | POA: Insufficient documentation

## 2020-01-04 DIAGNOSIS — M48062 Spinal stenosis, lumbar region with neurogenic claudication: Secondary | ICD-10-CM | POA: Insufficient documentation

## 2020-01-04 DIAGNOSIS — Z419 Encounter for procedure for purposes other than remedying health state, unspecified: Secondary | ICD-10-CM

## 2020-01-04 DIAGNOSIS — M545 Low back pain: Secondary | ICD-10-CM | POA: Diagnosis not present

## 2020-01-04 HISTORY — PX: LUMBAR LAMINECTOMY/DECOMPRESSION MICRODISCECTOMY: SHX5026

## 2020-01-04 LAB — GLUCOSE, CAPILLARY: Glucose-Capillary: 198 mg/dL — ABNORMAL HIGH (ref 70–99)

## 2020-01-04 SURGERY — LUMBAR LAMINECTOMY/DECOMPRESSION MICRODISCECTOMY 1 LEVEL
Anesthesia: General | Site: Spine Lumbar

## 2020-01-04 MED ORDER — MENTHOL 3 MG MT LOZG: 1.0000 | LOZENGE | OROMUCOSAL | Status: DC | PRN

## 2020-01-04 MED ORDER — OXYCODONE HCL 5 MG/5ML PO SOLN: 5.0000 mg | Freq: Once | ORAL | Status: AC | PRN

## 2020-01-04 MED ORDER — SODIUM CHLORIDE 0.9 % IV SOLN
INTRAVENOUS | Status: DC | PRN
  Administered 2020-01-04: 500 mL

## 2020-01-04 MED ORDER — ONDANSETRON HCL 4 MG/2ML IJ SOLN: 4.0000 mg | Freq: Four times a day (QID) | INTRAMUSCULAR | Status: DC | PRN

## 2020-01-04 MED ORDER — OXYCODONE HCL 5 MG PO TABS: 5.0000 mg | ORAL_TABLET | Freq: Once | ORAL | Status: AC | PRN

## 2020-01-04 MED ORDER — LIDOCAINE-EPINEPHRINE 1 %-1:100000 IJ SOLN
INTRAMUSCULAR | Status: DC | PRN
  Administered 2020-01-04 (×2): 10 mL

## 2020-01-04 MED ORDER — FENTANYL CITRATE (PF) 100 MCG/2ML IJ SOLN
INTRAMUSCULAR | Status: AC
  Administered 2020-01-04: 50 ug via INTRAVENOUS
  Filled 2020-01-04: qty 2

## 2020-01-04 MED ORDER — ACETAMINOPHEN 325 MG PO TABS: 650.0000 mg | ORAL_TABLET | ORAL | Status: DC | PRN

## 2020-01-04 MED ORDER — CEFAZOLIN SODIUM-DEXTROSE 2-4 GM/100ML-% IV SOLN
2.0000 g | INTRAVENOUS | Status: AC
  Administered 2020-01-04: 2 g via INTRAVENOUS

## 2020-01-04 MED ORDER — MIDAZOLAM HCL 5 MG/5ML IJ SOLN
INTRAMUSCULAR | Status: DC | PRN
  Administered 2020-01-04: 2 mg via INTRAVENOUS

## 2020-01-04 MED ORDER — LIDOCAINE 2% (20 MG/ML) 5 ML SYRINGE
INTRAMUSCULAR | Status: AC
  Filled 2020-01-04: qty 5

## 2020-01-04 MED ORDER — DILTIAZEM HCL ER COATED BEADS 120 MG PO CP24
240.0000 mg | ORAL_CAPSULE | Freq: Every day | ORAL | Status: DC
  Administered 2020-01-04: 240 mg via ORAL
  Filled 2020-01-04: qty 2
  Filled 2020-01-04: qty 1

## 2020-01-04 MED ORDER — DOCUSATE SODIUM 100 MG PO CAPS
100.0000 mg | ORAL_CAPSULE | Freq: Two times a day (BID) | ORAL | Status: DC
  Administered 2020-01-04: 100 mg via ORAL
  Filled 2020-01-04: qty 1

## 2020-01-04 MED ORDER — GLYCOPYRROLATE PF 0.2 MG/ML IJ SOSY
PREFILLED_SYRINGE | INTRAMUSCULAR | Status: DC | PRN
  Administered 2020-01-04: .2 mg via INTRAVENOUS

## 2020-01-04 MED ORDER — CHLORHEXIDINE GLUCONATE CLOTH 2 % EX PADS: 6.0000 | MEDICATED_PAD | Freq: Once | CUTANEOUS | Status: DC

## 2020-01-04 MED ORDER — PROPOFOL 10 MG/ML IV BOLUS
INTRAVENOUS | Status: DC | PRN
  Administered 2020-01-04: 200 mg via INTRAVENOUS

## 2020-01-04 MED ORDER — ONDANSETRON HCL 4 MG PO TABS: 4.0000 mg | ORAL_TABLET | Freq: Four times a day (QID) | ORAL | Status: DC | PRN

## 2020-01-04 MED ORDER — CYCLOBENZAPRINE HCL 5 MG PO TABS: 5.0000 mg | ORAL_TABLET | Freq: Three times a day (TID) | ORAL | 0 refills | Status: DC | PRN

## 2020-01-04 MED ORDER — 0.9 % SODIUM CHLORIDE (POUR BTL) OPTIME
TOPICAL | Status: DC | PRN
  Administered 2020-01-04: 1000 mL

## 2020-01-04 MED ORDER — OXYCODONE-ACETAMINOPHEN 5-325 MG PO TABS: 1.0000 | ORAL_TABLET | Freq: Four times a day (QID) | ORAL | 0 refills | Status: AC | PRN

## 2020-01-04 MED ORDER — SODIUM CHLORIDE 0.9 % IV SOLN: 250.0000 mL | INTRAVENOUS | Status: DC

## 2020-01-04 MED ORDER — ROCURONIUM BROMIDE 10 MG/ML (PF) SYRINGE
PREFILLED_SYRINGE | INTRAVENOUS | Status: AC
  Filled 2020-01-04: qty 10

## 2020-01-04 MED ORDER — SUGAMMADEX SODIUM 200 MG/2ML IV SOLN
INTRAVENOUS | Status: DC | PRN
  Administered 2020-01-04: 200 mg via INTRAVENOUS

## 2020-01-04 MED ORDER — MIDAZOLAM HCL 2 MG/2ML IJ SOLN
INTRAMUSCULAR | Status: AC
  Filled 2020-01-04: qty 2

## 2020-01-04 MED ORDER — BISACODYL 10 MG RE SUPP: 10.0000 mg | Freq: Every day | RECTAL | Status: DC | PRN

## 2020-01-04 MED ORDER — OXYCODONE HCL 5 MG PO TABS
10.0000 mg | ORAL_TABLET | ORAL | Status: DC | PRN
  Administered 2020-01-04 (×2): 10 mg via ORAL
  Filled 2020-01-04 (×2): qty 2

## 2020-01-04 MED ORDER — PROPOFOL 10 MG/ML IV BOLUS
INTRAVENOUS | Status: AC
  Filled 2020-01-04: qty 40

## 2020-01-04 MED ORDER — THROMBIN 5000 UNITS EX SOLR: CUTANEOUS | Status: DC | PRN

## 2020-01-04 MED ORDER — ONDANSETRON HCL 4 MG/2ML IJ SOLN
INTRAMUSCULAR | Status: AC
  Filled 2020-01-04: qty 2

## 2020-01-04 MED ORDER — SODIUM CHLORIDE 0.9% FLUSH: 3.0000 mL | INTRAVENOUS | Status: DC | PRN

## 2020-01-04 MED ORDER — FENTANYL CITRATE (PF) 100 MCG/2ML IJ SOLN
INTRAMUSCULAR | Status: DC | PRN
  Administered 2020-01-04: 50 ug via INTRAVENOUS
  Administered 2020-01-04: 150 ug via INTRAVENOUS

## 2020-01-04 MED ORDER — BACITRACIN ZINC 500 UNIT/GM EX OINT
TOPICAL_OINTMENT | CUTANEOUS | Status: DC | PRN
  Administered 2020-01-04: 1 via TOPICAL

## 2020-01-04 MED ORDER — ROCURONIUM BROMIDE 50 MG/5ML IV SOSY
PREFILLED_SYRINGE | INTRAVENOUS | Status: DC | PRN
  Administered 2020-01-04: 20 mg via INTRAVENOUS
  Administered 2020-01-04: 50 mg via INTRAVENOUS

## 2020-01-04 MED ORDER — SODIUM CHLORIDE 0.9% FLUSH: 3.0000 mL | Freq: Two times a day (BID) | INTRAVENOUS | Status: DC

## 2020-01-04 MED ORDER — SODIUM CHLORIDE 0.9 % IV SOLN
INTRAVENOUS | Status: DC | PRN
  Administered 2020-01-04: 25 ug/min via INTRAVENOUS

## 2020-01-04 MED ORDER — LIDOCAINE 2% (20 MG/ML) 5 ML SYRINGE
INTRAMUSCULAR | Status: DC | PRN
  Administered 2020-01-04: 60 mg via INTRAVENOUS

## 2020-01-04 MED ORDER — ONDANSETRON HCL 4 MG/2ML IJ SOLN
INTRAMUSCULAR | Status: DC | PRN
  Administered 2020-01-04: 4 mg via INTRAVENOUS

## 2020-01-04 MED ORDER — FENTANYL CITRATE (PF) 100 MCG/2ML IJ SOLN
25.0000 ug | INTRAMUSCULAR | Status: DC | PRN
  Administered 2020-01-04: 50 ug via INTRAVENOUS

## 2020-01-04 MED ORDER — CYCLOBENZAPRINE HCL 10 MG PO TABS
10.0000 mg | ORAL_TABLET | Freq: Three times a day (TID) | ORAL | Status: DC | PRN
  Administered 2020-01-04: 10 mg via ORAL
  Filled 2020-01-04: qty 1

## 2020-01-04 MED ORDER — THROMBIN 5000 UNITS EX SOLR
OROMUCOSAL | Status: DC | PRN
  Administered 2020-01-04: 5 mL via TOPICAL

## 2020-01-04 MED ORDER — LACTATED RINGERS IV SOLN: INTRAVENOUS | Status: DC | PRN

## 2020-01-04 MED ORDER — MORPHINE SULFATE (PF) 4 MG/ML IV SOLN: 4.0000 mg | INTRAVENOUS | Status: DC | PRN

## 2020-01-04 MED ORDER — THROMBIN 5000 UNITS EX SOLR
CUTANEOUS | Status: AC
  Filled 2020-01-04: qty 5000

## 2020-01-04 MED ORDER — CYCLOBENZAPRINE HCL 10 MG PO TABS
ORAL_TABLET | ORAL | Status: AC
  Administered 2020-01-04: 10 mg via ORAL
  Filled 2020-01-04: qty 1

## 2020-01-04 MED ORDER — DEXAMETHASONE SODIUM PHOSPHATE 10 MG/ML IJ SOLN
INTRAMUSCULAR | Status: AC
  Filled 2020-01-04: qty 1

## 2020-01-04 MED ORDER — FENTANYL CITRATE (PF) 250 MCG/5ML IJ SOLN
INTRAMUSCULAR | Status: AC
  Filled 2020-01-04: qty 5

## 2020-01-04 MED ORDER — BACITRACIN ZINC 500 UNIT/GM EX OINT
TOPICAL_OINTMENT | CUTANEOUS | Status: AC
  Filled 2020-01-04: qty 28.35

## 2020-01-04 MED ORDER — ACETAMINOPHEN 650 MG RE SUPP: 650.0000 mg | RECTAL | Status: DC | PRN

## 2020-01-04 MED ORDER — OXYCODONE HCL 5 MG PO TABS
ORAL_TABLET | ORAL | Status: AC
  Administered 2020-01-04: 5 mg via ORAL
  Filled 2020-01-04: qty 1

## 2020-01-04 MED ORDER — ACETAMINOPHEN 500 MG PO TABS
1000.0000 mg | ORAL_TABLET | Freq: Four times a day (QID) | ORAL | Status: DC
  Administered 2020-01-04 (×2): 1000 mg via ORAL
  Filled 2020-01-04 (×2): qty 2

## 2020-01-04 MED ORDER — OXYCODONE HCL 5 MG PO TABS: 5.0000 mg | ORAL_TABLET | ORAL | Status: DC | PRN

## 2020-01-04 MED ORDER — CEFAZOLIN SODIUM-DEXTROSE 2-4 GM/100ML-% IV SOLN
INTRAVENOUS | Status: AC
  Filled 2020-01-04: qty 100

## 2020-01-04 MED ORDER — PHENYLEPHRINE HCL (PRESSORS) 10 MG/ML IV SOLN
INTRAVENOUS | Status: DC | PRN
  Administered 2020-01-04 (×2): 80 ug via INTRAVENOUS
  Administered 2020-01-04 (×2): 40 ug via INTRAVENOUS
  Administered 2020-01-04 (×2): 80 ug via INTRAVENOUS

## 2020-01-04 MED ORDER — LIDOCAINE-EPINEPHRINE 1 %-1:100000 IJ SOLN
INTRAMUSCULAR | Status: AC
  Filled 2020-01-04: qty 1

## 2020-01-04 MED ORDER — CEFAZOLIN SODIUM-DEXTROSE 2-4 GM/100ML-% IV SOLN
2.0000 g | Freq: Three times a day (TID) | INTRAVENOUS | Status: DC
  Administered 2020-01-04: 2 g via INTRAVENOUS
  Filled 2020-01-04: qty 100

## 2020-01-04 MED ORDER — PHENOL 1.4 % MT LIQD: 1.0000 | OROMUCOSAL | Status: DC | PRN

## 2020-01-04 MED ORDER — DEXAMETHASONE SODIUM PHOSPHATE 10 MG/ML IJ SOLN
INTRAMUSCULAR | Status: DC | PRN
  Administered 2020-01-04: 10 mg via INTRAVENOUS

## 2020-01-04 SURGICAL SUPPLY — 55 items
BAG DECANTER FOR FLEXI CONT (MISCELLANEOUS) ×3 IMPLANT
BENZOIN TINCTURE PRP APPL 2/3 (GAUZE/BANDAGES/DRESSINGS) ×3 IMPLANT
BLADE CLIPPER SURG (BLADE) IMPLANT
BUR MATCHSTICK NEURO 3.0 LAGG (BURR) ×3 IMPLANT
BUR PRECISION FLUTE 6.0 (BURR) ×3 IMPLANT
CANISTER SUCT 3000ML PPV (MISCELLANEOUS) ×3 IMPLANT
CARTRIDGE OIL MAESTRO DRILL (MISCELLANEOUS) ×1 IMPLANT
CLOSURE WOUND 1/2 X4 (GAUZE/BANDAGES/DRESSINGS) ×1
COVER WAND RF STERILE (DRAPES) IMPLANT
DIFFUSER DRILL AIR PNEUMATIC (MISCELLANEOUS) ×3 IMPLANT
DRAPE LAPAROTOMY 100X72X124 (DRAPES) ×3 IMPLANT
DRAPE MICROSCOPE LEICA (MISCELLANEOUS) ×3 IMPLANT
DRAPE SURG 17X23 STRL (DRAPES) ×12 IMPLANT
DRSG OPSITE POSTOP 4X6 (GAUZE/BANDAGES/DRESSINGS) ×3 IMPLANT
ELECT BLADE 4.0 EZ CLEAN MEGAD (MISCELLANEOUS) ×3
ELECT REM PT RETURN 9FT ADLT (ELECTROSURGICAL) ×3
ELECTRODE BLDE 4.0 EZ CLN MEGD (MISCELLANEOUS) ×1 IMPLANT
ELECTRODE REM PT RTRN 9FT ADLT (ELECTROSURGICAL) ×1 IMPLANT
GAUZE 4X4 16PLY RFD (DISPOSABLE) IMPLANT
GAUZE SPONGE 4X4 12PLY STRL (GAUZE/BANDAGES/DRESSINGS) IMPLANT
GLOVE BIO SURGEON STRL SZ 6.5 (GLOVE) ×2 IMPLANT
GLOVE BIO SURGEON STRL SZ8 (GLOVE) ×3 IMPLANT
GLOVE BIO SURGEON STRL SZ8.5 (GLOVE) ×3 IMPLANT
GLOVE BIO SURGEONS STRL SZ 6.5 (GLOVE) ×1
GLOVE BIOGEL PI IND STRL 6.5 (GLOVE) ×1 IMPLANT
GLOVE BIOGEL PI IND STRL 7.0 (GLOVE) ×2 IMPLANT
GLOVE BIOGEL PI IND STRL 8 (GLOVE) ×2 IMPLANT
GLOVE BIOGEL PI INDICATOR 6.5 (GLOVE) ×2
GLOVE BIOGEL PI INDICATOR 7.0 (GLOVE) ×4
GLOVE BIOGEL PI INDICATOR 8 (GLOVE) ×4
GLOVE ECLIPSE 7.5 STRL STRAW (GLOVE) ×9 IMPLANT
GLOVE EXAM NITRILE XL STR (GLOVE) IMPLANT
GOWN STRL REUS W/ TWL LRG LVL3 (GOWN DISPOSABLE) ×1 IMPLANT
GOWN STRL REUS W/ TWL XL LVL3 (GOWN DISPOSABLE) ×1 IMPLANT
GOWN STRL REUS W/TWL 2XL LVL3 (GOWN DISPOSABLE) ×3 IMPLANT
GOWN STRL REUS W/TWL LRG LVL3 (GOWN DISPOSABLE) ×2
GOWN STRL REUS W/TWL XL LVL3 (GOWN DISPOSABLE) ×2
KIT BASIN OR (CUSTOM PROCEDURE TRAY) ×3 IMPLANT
KIT TURNOVER KIT B (KITS) ×3 IMPLANT
NEEDLE HYPO 21X1.5 SAFETY (NEEDLE) IMPLANT
NEEDLE HYPO 22GX1.5 SAFETY (NEEDLE) ×3 IMPLANT
NS IRRIG 1000ML POUR BTL (IV SOLUTION) ×3 IMPLANT
OIL CARTRIDGE MAESTRO DRILL (MISCELLANEOUS) ×3
PACK LAMINECTOMY NEURO (CUSTOM PROCEDURE TRAY) ×3 IMPLANT
PAD ARMBOARD 7.5X6 YLW CONV (MISCELLANEOUS) ×15 IMPLANT
PATTIES SURGICAL .5 X1 (DISPOSABLE) IMPLANT
RUBBERBAND STERILE (MISCELLANEOUS) ×6 IMPLANT
SPONGE SURGIFOAM ABS GEL SZ50 (HEMOSTASIS) IMPLANT
STRIP CLOSURE SKIN 1/2X4 (GAUZE/BANDAGES/DRESSINGS) ×2 IMPLANT
SUT VIC AB 1 CT1 18XBRD ANBCTR (SUTURE) ×1 IMPLANT
SUT VIC AB 1 CT1 8-18 (SUTURE) ×2
SUT VIC AB 2-0 CP2 18 (SUTURE) ×3 IMPLANT
TOWEL GREEN STERILE (TOWEL DISPOSABLE) ×3 IMPLANT
TOWEL GREEN STERILE FF (TOWEL DISPOSABLE) ×3 IMPLANT
WATER STERILE IRR 1000ML POUR (IV SOLUTION) ×3 IMPLANT

## 2020-01-04 NOTE — Anesthesia Preprocedure Evaluation (Signed)
Anesthesia Evaluation  Patient identified by MRN, date of birth, ID band Patient awake    Reviewed: Allergy & Precautions, H&P , NPO status , Patient's Chart, lab work & pertinent test results  Airway Mallampati: II   Neck ROM: full    Dental   Pulmonary former smoker,    breath sounds clear to auscultation       Cardiovascular hypertension,  Rhythm:regular Rate:Normal     Neuro/Psych PSYCHIATRIC DISORDERS Anxiety Depression Spinal stenosis    GI/Hepatic   Endo/Other    Renal/GU      Musculoskeletal  (+) Arthritis ,   Abdominal   Peds  Hematology   Anesthesia Other Findings   Reproductive/Obstetrics                             Anesthesia Physical Anesthesia Plan  ASA: II  Anesthesia Plan: General   Post-op Pain Management:    Induction: Intravenous  PONV Risk Score and Plan: 2 and Ondansetron, Dexamethasone, Midazolam and Treatment may vary due to age or medical condition  Airway Management Planned: Oral ETT  Additional Equipment:   Intra-op Plan:   Post-operative Plan: Extubation in OR  Informed Consent: I have reviewed the patients History and Physical, chart, labs and discussed the procedure including the risks, benefits and alternatives for the proposed anesthesia with the patient or authorized representative who has indicated his/her understanding and acceptance.       Plan Discussed with: CRNA, Anesthesiologist and Surgeon  Anesthesia Plan Comments:         Anesthesia Quick Evaluation

## 2020-01-04 NOTE — Anesthesia Procedure Notes (Signed)
Procedure Name: Intubation Date/Time: 01/04/2020 7:31 AM Performed by: Mayer Camel, CRNA Pre-anesthesia Checklist: Patient identified, Emergency Drugs available, Suction available and Patient being monitored Patient Re-evaluated:Patient Re-evaluated prior to induction Oxygen Delivery Method: Circle System Utilized Preoxygenation: Pre-oxygenation with 100% oxygen Induction Type: IV induction Ventilation: Mask ventilation without difficulty Grade View: Grade I Tube type: Oral Tube size: 7.5 mm Number of attempts: 1 Airway Equipment and Method: Stylet and Oral airway Placement Confirmation: ETT inserted through vocal cords under direct vision,  positive ETCO2 and breath sounds checked- equal and bilateral Secured at: 23 cm Tube secured with: Tape Dental Injury: Teeth and Oropharynx as per pre-operative assessment  Comments: Intubation performed by Genia Del SRNA

## 2020-01-04 NOTE — Progress Notes (Signed)
Occupational Therapy Evaluation Patient Details Name: Tyrone Nelson MRN: [id number] DOB: 01-14-1971 Today's Date: 01/04/2020    History of Present Illness Pt is 49 yo male with PMH of back pain, lumbar stenosis, and C spine surgery in 2017. Pt s/p bil L4-5 laminotomy/foraminotomy using microdissection on 01/04/20   Clinical Impression   PTA, pt was living at home, reports his mom will be staying with him, pt reports he was independent with ADL/IADL and modified independent with functional mobility with intermittent use of his walking stick. Pt currently demonstrates ability to complete full body dressing with supervision for safety and adherence to back precautions. He verbalized adherence to back precautions with hypothetical scenarios upon returning home. Pt demonstrates functional mobility with supervision with use of walking stick. He required minguard for sit<>stand transfers for proper body mechanics to adhere to precautions. Patient evaluated by Occupational Therapy with no further acute OT needs identified. All education has been completed and the patient has no further questions. See below for any follow-up Occupational Therapy or equipment needs. OT to sign off. Thank you for referral.      Follow Up Recommendations  No OT follow up    Equipment Recommendations  3 in 1 bedside commode    Recommendations for Other Services       Precautions / Restrictions Precautions Precautions: Back;Fall Precaution Booklet Issued: Yes (comment) Restrictions Weight Bearing Restrictions: No      Mobility Bed Mobility Overal bed mobility: Needs Assistance Bed Mobility: Sit to Sidelying;Rolling Rolling: Supervision       Sit to sidelying: Min guard General bed mobility comments: minguard for technique  Transfers Overall transfer level: Needs assistance Equipment used: None Transfers: Sit to/from Stand Sit to Stand: Min guard         General transfer comment: minguard for  proper body mechanics to keep back straight    Balance Overall balance assessment: Needs assistance Sitting-balance support: No upper extremity supported;Feet supported Sitting balance-Leahy Scale: Normal     Standing balance support: No upper extremity supported;During functional activity Standing balance-Leahy Scale: Fair Standing balance comment: able to stand without support                            ADL either performed or assessed with clinical judgement   ADL Overall ADL's : Needs assistance/impaired                                     Functional mobility during ADLs: Supervision/safety(walking stick) General ADL Comments: pt required supervision for full body dressing while sitting EOB;he demonstrated ability to donn LB clothing while sitting EOB;     Vision         Perception     Praxis      Pertinent Vitals/Pain Pain Assessment: 0-10 Pain Score: 8  Pain Location: back only - no radiating pain Pain Descriptors / Indicators: Operative site guarding;Sore Pain Intervention(s): Limited activity within patient's tolerance;Monitored during session;Patient requesting pain meds-RN notified     Hand Dominance     Extremity/Trunk Assessment Upper Extremity Assessment Upper Extremity Assessment: Overall WFL for tasks assessed   Lower Extremity Assessment Lower Extremity Assessment: Defer to PT evaluation RLE Deficits / Details: ROM WFL; Ankle dorsiflexion 5/5, all other at least 3/5 not further tested RLE Sensation: decreased light touch LLE Deficits / Details: ROM WFL; Ankle dorsiflexion 4+/5, all other at least  3/5 not further tested LLE Sensation: decreased light touch   Cervical / Trunk Assessment Cervical / Trunk Assessment: Normal   Communication     Cognition Arousal/Alertness: Awake/alert Behavior During Therapy: WFL for tasks assessed/performed Overall Cognitive Status: Within Functional Limits for tasks assessed                                  General Comments: demonstrates good awareness of safety and good carry over of 3 precautions   General Comments  vss    Exercises     Shoulder Instructions      Home Living Family/patient expects to be discharged to:: Private residence Living Arrangements: Alone(parents live next door and reports he can stay with them for a little while) Available Help at Discharge: Family;Friend(s);Available 24 hours/day Type of Home: House Home Access: Level entry     Home Layout: One level         Bathroom Toilet: Standard     Home Equipment: Other (comment)(walking stick)          Prior Functioning/Environment Level of Independence: Needs assistance  Gait / Transfers Assistance Needed: Reports short community distances with his walking stick.  Limited by back pain and numbness. ADL's / Homemaking Assistance Needed: Reports could perform ADLs with increasing difficulty.  Had difficulty reaching feet. (but reports doesn't want to become dependent on DME like walker or ADL kit)            OT Problem List: Impaired balance (sitting and/or standing);Decreased knowledge of precautions;Pain      OT Treatment/Interventions:      OT Goals(Current goals can be found in the care plan section) Acute Rehab OT Goals Patient Stated Goal: return home OT Goal Formulation: With patient Time For Goal Achievement: 01/18/20 Potential to Achieve Goals: Good  OT Frequency:     Barriers to D/C:            Co-evaluation              AM-PAC OT "6 Clicks" Daily Activity     Outcome Measure Help from another person eating meals?: A Little Help from another person taking care of personal grooming?: A Little Help from another person toileting, which includes using toliet, bedpan, or urinal?: A Little Help from another person bathing (including washing, rinsing, drying)?: A Little Help from another person to put on and taking off regular upper body  clothing?: A Little Help from another person to put on and taking off regular lower body clothing?: A Little 6 Click Score: 18   End of Session Equipment Utilized During Treatment: (walking stick) Nurse Communication: Mobility status  Activity Tolerance: Patient tolerated treatment well Patient left: in bed;with call bell/phone within reach;with family/visitor present  OT Visit Diagnosis: Other abnormalities of gait and mobility (R26.89);Pain Pain - part of body: (back)                Time: 9326-7124 OT Time Calculation (min): 23 min Charges:  OT General Charges $OT Visit: 1 Visit OT Evaluation $OT Eval Low Complexity: 1 Low OT Treatments $Self Care/Home Management : 8-22 mins  Tyrone Nelson OTR/L Acute Rehabilitation Services Office: [phone]   Wyn Forster 01/04/2020, 5:51 PM

## 2020-01-04 NOTE — H&P (Signed)
Subjective: The patient is a 49 year old black male who has complained of back and bilateral leg pain and numbness consistent with neurogenic claudication.  He failed medical management and was worked up with a lumbar MRI which demonstrates mild stenosis at L4-5.  I discussed the various treatment options with him.  He has decided proceed with surgery.  Past Medical History:  Diagnosis Date  . Anxiety   . Arthritis   . Depression   . Hypertension   . Spinal stenosis, lumbar region with neurogenic claudication     Past Surgical History:  Procedure Laterality Date  . CERVICAL SPINE SURGERY  2017  . SPINE SURGERY  2017   vertebrae putting pressure on spinal cord    No Known Allergies  Social History   Tobacco Use  . Smoking status: Former Smoker    Packs/day: 0.50    Years: 10.00    Pack years: 5.00    Types: Cigarettes    Quit date: 05/08/2019    Years since quitting: 0.6  . Smokeless tobacco: Never Used  Substance Use Topics  . Alcohol use: Yes    Comment: occasional    Family History  Problem Relation Age of Onset  . Hypertension Mother   . Cancer Brother    Prior to Admission medications   Medication Sig Start Date End Date Taking? Authorizing Provider  diltiazem (CARDIZEM CD) 240 MG 24 hr capsule Take 1 capsule (240 mg total) by mouth daily. For blood pressure (Needs to be seen before next refill) 11/02/19  Yes Stacks, Broadus John, MD  pregabalin (LYRICA) 300 MG capsule Take 1 capsule (300 mg total) by mouth daily. Patient not taking: Reported on 11/26/2019 07/03/19   Mechele Claude, MD  traZODone (DESYREL) 300 MG tablet Take 1 tablet (300 mg total) by mouth at bedtime as needed for sleep. Patient not taking: Reported on 11/26/2019 01/16/19   Mechele Claude, MD     Review of Systems  Positive ROS: As above  All other systems have been reviewed and were otherwise negative with the exception of those mentioned in the HPI and as above.  Objective: Vital signs in last 24  hours: Temp:  [98 F (36.7 C)] 98 F (36.7 C) (02/22 0615) Pulse Rate:  [69] 69 (02/22 0615) Resp:  [19] 19 (02/22 0615) BP: (144)/(95) 144/95 (02/22 0615) SpO2:  [100 %] 100 % (02/22 0615) Weight:  [102.1 kg] 102.1 kg (02/22 0615) Estimated body mass index is 27.39 kg/m as calculated from the following:   Height as of this encounter: 6\' 4"  (1.93 m).   Weight as of this encounter: 102.1 kg.   General Appearance: Alert Head: Normocephalic, without obvious abnormality, atraumatic Eyes: PERRL, conjunctiva/corneas clear, EOM's intact,    Ears: Normal  Throat: Normal  Neck: Supple, Back: unremarkable Lungs: Clear to auscultation bilaterally, respirations unlabored Heart: Regular rate and rhythm, no murmur, rub or gallop Abdomen: Soft, non-tender Extremities: Extremities normal, atraumatic, no cyanosis or edema Skin: unremarkable  NEUROLOGIC:   Mental status: alert and oriented,Motor Exam - grossly normal Sensory Exam - grossly normal Reflexes:  Coordination - grossly normal Gait - grossly normal Balance - grossly normal Cranial Nerves: I: smell Not tested  II: visual acuity  OS: Normal  OD: Normal   II: visual fields Full to confrontation  II: pupils Equal, round, reactive to light  III,VII: ptosis None  III,IV,VI: extraocular muscles  Full ROM  V: mastication Normal  V: facial light touch sensation  Normal  V,VII: corneal reflex  Present  VII: facial muscle function - upper  Normal  VII: facial muscle function - lower Normal  VIII: hearing Not tested  IX: soft palate elevation  Normal  IX,X: gag reflex Present  XI: trapezius strength  5/5  XI: sternocleidomastoid strength 5/5  XI: neck flexion strength  5/5  XII: tongue strength  Normal    Data Review Lab Results  Component Value Date   WBC 5.4 01/01/2020   HGB 14.6 01/01/2020   HCT 44.0 01/01/2020   MCV 96.5 01/01/2020   PLT 197 01/01/2020   Lab Results  Component Value Date   NA 141 01/01/2020   K  4.7 01/01/2020   CL 106 01/01/2020   CO2 26 01/01/2020   BUN 13 01/01/2020   CREATININE 1.26 (H) 01/01/2020   GLUCOSE 87 01/01/2020   No results found for: INR, PROTIME  Assessment/Plan: L4-5 spinal stenosis, lumbago, lumbar radiculopathy, neurogenic claudication: I have discussed the situation with the patient.  I reviewed his imaging studies with him and pointed out the abnormalities.  We have discussed the various treatment options including surgery.  I have described the surgical treatment option of a bilateral L4-5 laminectomy/laminotomy/foraminotomies.  I have shown him surgical models.  I have given him a surgical pamphlet.  We have discussed the risks, benefits, alternatives, expected postoperative course, and likelihood of achieving our goals with surgery.  I have answered all his questions.  He has decided to proceed with surgery.   Ophelia Charter 01/04/2020 7:22 AM

## 2020-01-04 NOTE — Progress Notes (Signed)
Pt was wheeled down to his transport for discharge to home; he was feeling well, no acute complaints of any pain nor discomfort; cane and belongings held by his hand; discharge instructions given by previous RN accompanied by the undersigned; he expressed understanding on the teachings given.

## 2020-01-04 NOTE — Discharge Instructions (Signed)
Wound Care Leave incision open to air. You may shower. Do not scrub directly on incision.  Do not put any creams, lotions, or ointments on incision. Activity Walk each and every day, increasing distance each day. No lifting greater than 5 lbs.  Avoid bending, arching, and twisting. No driving for 2 weeks; may ride as a passenger locally. Diet Resume your normal diet.  Return to Work Will be discussed at you follow up appointment. Call Your Doctor If Any of These Occur Redness, drainage, or swelling at the wound.  Temperature greater than 101 degrees. Severe pain not relieved by pain medication. Incision starts to come apart. Follow Up Appt Call today for appointment in 2-4 weeks ([phone]) or for problems.  If you have any hardware placed in your spine, you will need an x-ray before your appointment. 

## 2020-01-04 NOTE — Transfer of Care (Signed)
Immediate Anesthesia Transfer of Care Note  Patient: Tyrone Nelson  Procedure(s) Performed: Lumbar Four-Five Laminectomy/Foraminotomy (N/A Spine Lumbar)  Patient Location: PACU  Anesthesia Type:General  Level of Consciousness: awake and alert   Airway & Oxygen Therapy: Patient Spontanous Breathing and Patient connected to face mask oxygen  Post-op Assessment: Report given to RN, Post -op Vital signs reviewed and stable and Patient moving all extremities  Post vital signs: Reviewed  Last Vitals:  Vitals Value Taken Time  BP 140/126 01/04/20 0943  Temp    Pulse 100 01/04/20 0945  Resp 16 01/04/20 0945  SpO2 99 % 01/04/20 0945  Vitals shown include unvalidated device data.  Last Pain:  Vitals:   01/04/20 9806  TempSrc:   PainSc: 6          Complications: No apparent anesthesia complications

## 2020-01-04 NOTE — Progress Notes (Signed)
Subjective: The patient is alert and pleasant.  His back is appropriately sore.  Objective: Vital signs in last 24 hours: Temp:  [98 F (36.7 C)] 98 F (36.7 C) (02/22 0944) Pulse Rate:  [69-100] 100 (02/22 0947) Resp:  [16-20] 16 (02/22 0947) BP: (124-144)/(95-126) 124/104 (02/22 0947) SpO2:  [100 %] 100 % (02/22 0947) Weight:  [102.1 kg] 102.1 kg (02/22 0615) Estimated body mass index is 27.39 kg/m as calculated from the following:   Height as of this encounter: 6\' 4"  (1.93 m).   Weight as of this encounter: 102.1 kg.   Intake/Output from previous day: No intake/output data recorded. Intake/Output this shift: Total I/O In: 1100 [I.V.:1000; IV Piggyback:100] Out: 100 [Blood:100]  Physical exam the patient is alert and pleasant.  He is moving his lower extremities well.  Lab Results: No results for input(s): WBC, HGB, HCT, PLT in the last 72 hours. BMET No results for input(s): NA, K, CL, CO2, GLUCOSE, BUN, CREATININE, CALCIUM in the last 72 hours.  Studies/Results: DG Lumbar Spine 2-3 Views  Result Date: 01/04/2020 CLINICAL DATA:  Pain EXAM: LUMBAR SPINE - 2-3 VIEW COMPARISON:  None. FINDINGS: Two cross-table lateral x-rays of the lumbar spine. Posterior metallic instrument with the tip projecting over the L4 spinous process. Posterior metallic probe with the tip projecting over the inferior articulating facet of L4. Diffuse lumbar spine spondylosis. IMPRESSION: Intraoperative localization. Electronically Signed   By: 01/06/2020   On: 01/04/2020 09:57    Assessment/Plan: Status post L4-5 bilateral laminotomy/foraminotomies: The patient is doing well.  I spoke with his mother.  LOS: 0 days     01/06/2020 01/04/2020, 10:14 AM

## 2020-01-04 NOTE — Op Note (Signed)
Brief history: The patient is a 49 year old black male who has complained of back and leg pain consistent with neurogenic claudication.  He has failed medical management and was worked up with a lumbar MRI which demonstrated degenerative changes and stenosis at L4-5.  I discussed the various treatment options with him.  He has weighed the risks, benefits and alternatives of surgery and decided to proceed with an L4-5 decompression.  Preoperative diagnosis: L4-5 spinal stenosis, facet arthropathy, lumbago, lumbar radiculopathy, neurogenic claudication  Postoperative diagnosis: The same  Procedure: Bilateral L4-5 laminotomy/foraminotomy using microdissection   Surgeon: Dr. Delma Officer  Asst.: Hildred Priest nurse practitioner  Anesthesia: Gen. endotracheal  Estimated blood loss: 75 cc  Drains: None  Complications: None  Description of procedure: The patient was brought to the operating room by the anesthesia team. General endotracheal anesthesia was induced. The patient was turned to the prone position on the Wilson frame. The patient's lumbosacral region was then prepared with Betadine scrub and Betadine solution. Sterile drapes were applied.  I then injected the area to be incised with Marcaine with epinephrine solution. I then used a scalpel to make a linear midline incision over the L4-5 intervertebral disc space. I then used electrocautery to perform a bilateral subperiosteal dissection exposing the spinous process and lamina of L4-5 bilaterally. We obtained intraoperative radiograph to confirm our location. I then inserted the [facility] retractor for exposure.  We then brought the operative microscope into the field. Under its magnification and illumination we completed the microdissection. I used a high-speed drill to perform a laminotomy at L4-5 on the right. I then used a Kerrison punches to widen the laminotomy and removed the ligamentum flavum at L4-5 on the right. We then used  microdissection to free up the thecal sac and the right L5 nerve root from the epidural tissue. I then used a Kerrison punch to perform a foraminotomy at about the right L5 nerve root.  The assistant then used the nerve root retractor to gently retract the thecal sac and the right L5 nerve root medially. This exposed the intervertebral disc.  There were no herniations so we did not perform an intervertebral discectomy.  We then repeated this procedure in analogous fashion at L4-5 on the left decompressing the thecal sac and the L5 nerve root.  Again there was no disc herniation.  We did not perform an intervertebral discectomy.  I then palpated along the ventral surface of the thecal sac and along exit route of the bilateral L5 nerve root and noted that the neural structures were well decompressed. This completed the decompression.  We then obtained hemostasis using bipolar electrocautery. We irrigated the wound out with bacitracin solution. We then removed the retractor. We then reapproximated the patient's thoracolumbar fascia with interrupted #1 Vicryl suture. We then reapproximated the patient's subcutaneous tissue with interrupted 2-0 Vicryl suture. We then reapproximated patient's skin with Steri-Strips and benzoin. The was then coated with bacitracin ointment. The drapes were removed. The patient was subsequently returned to the supine position where they were extubated by the anesthesia team. The patient was then transported to the postanesthesia care unit in stable condition. All sponge instrument and needle counts were reportedly correct at the end of this case.

## 2020-01-04 NOTE — Evaluation (Signed)
Physical Therapy Evaluation Patient Details Name: Tyrone Nelson MRN: [id number] DOB: 06-26-1971 Today's Date: 01/04/2020   History of Present Illness  Pt is 49 yo male with PMH of back pain, lumbar stenosis, and C spine surgery in 2017. Pt s/p bil L4-5 laminotomy/foraminotomy using microdissection on 01/04/20  Clinical Impression  Pt admitted with above diagnosis. Pt was seen on DOS and able to ambulate 300' with min guard.  He demonstrated some gait abnormalities and deficits below that would benefit from further PT while hospitalized; however, his mobility is at a level that is safe to d/c home with family from PT perspective.  Spent time discussing precautions with ADLS, IADLs, and all transfers.   Pt currently with functional limitations due to the deficits listed below (see PT Problem List). Pt will benefit from skilled PT to increase their independence and safety with mobility to allow discharge to the venue listed below.       Follow Up Recommendations Follow surgeon's recommendation for DC plan and follow-up therapies    Equipment Recommendations  Other (comment)(has RW at home)    Recommendations for Other Services       Precautions / Restrictions Precautions Precautions: Back;Fall Precaution Booklet Issued: Yes (comment) Restrictions Weight Bearing Restrictions: No      Mobility  Bed Mobility Overal bed mobility: Needs Assistance Bed Mobility: Sidelying to Sit;Sit to Sidelying           General bed mobility comments: increased tiem and effort; cued for log roll  Transfers Overall transfer level: Needs assistance Equipment used: None Transfers: Sit to/from Stand Sit to Stand: Supervision         General transfer comment: performed x 2 without assistance  Ambulation/Gait Ambulation/Gait assistance: Supervision Gait Distance (Feet): 300 Feet Assistive device: Rolling walker (2 wheeled)(and walking stick) Gait Pattern/deviations: Shuffle Gait velocity:  decreased   General Gait Details: Pt wanting to ambulate with his walking stick and was able to do so for 200' - pt would switch hands and occasionally use it directly in front of him with both hands.. Then tried 75' with RW - pt with improved gait pattern and stability.  Did have decreased foot clearance on L (reports baseline) this was imprvoed with RW compared to walking stick. Advised use of RW (pt hesistant) encouraged at least to use RW for a few days.  Eduated on setting RW height.  Stairs            Wheelchair Mobility    Modified Rankin (Stroke Patients Only)       Balance Overall balance assessment: Needs assistance Sitting-balance support: No upper extremity supported;Feet supported Sitting balance-Leahy Scale: Normal     Standing balance support: No upper extremity supported;During functional activity Standing balance-Leahy Scale: Fair                               Pertinent Vitals/Pain Pain Assessment: 0-10 Pain Score: 8 (7/10 after PT and reposition) Pain Location: back only - no radiating pain Pain Descriptors / Indicators: Operative site guarding;Sore Pain Intervention(s): Limited activity within patient's tolerance;Repositioned;Monitored during session    Home Living Family/patient expects to be discharged to:: Private residence Living Arrangements: Alone(parents live next door and reports he can stay with them for a little while) Available Help at Discharge: Family;Friend(s);Available 24 hours/day Type of Home: House Home Access: Level entry     Home Layout: One level Home Equipment: Other (comment)(walking stick)  Prior Function Level of Independence: Needs assistance   Gait / Transfers Assistance Needed: Reports short community distances with his walking stick.  Limited by back pain and numbness.  ADL's / Homemaking Assistance Needed: Reports could perform ADLs with increasing difficulty.  Had difficulty reaching feet. (but  reports doesn't want to become dependent on DME like walker or ADL kit)        Hand Dominance        Extremity/Trunk Assessment   Upper Extremity Assessment Upper Extremity Assessment: Overall WFL for tasks assessed    Lower Extremity Assessment Lower Extremity Assessment: LLE deficits/detail;RLE deficits/detail RLE Deficits / Details: ROM WFL; Ankle dorsiflexion 5/5, all other at least 3/5 not further tested RLE Sensation: decreased light touch LLE Deficits / Details: ROM WFL; Ankle dorsiflexion 4+/5, all other at least 3/5 not further tested LLE Sensation: decreased light touch    Cervical / Trunk Assessment Cervical / Trunk Assessment: Normal  Communication      Cognition Arousal/Alertness: Awake/alert Behavior During Therapy: WFL for tasks assessed/performed Overall Cognitive Status: Within Functional Limits for tasks assessed                                        General Comments General comments (skin integrity, edema, etc.): Pt was given hand out and educated on back precautions and on maintenance of precautions with ADLs and transfers .    Exercises     Assessment/Plan    PT Assessment Patient needs continued PT services  PT Problem List Decreased strength;Decreased mobility;Decreased safety awareness;Decreased knowledge of precautions;Decreased activity tolerance;Decreased balance;Decreased knowledge of use of DME;Impaired sensation       PT Treatment Interventions DME instruction;Therapeutic activities;Gait training;Therapeutic exercise;Patient/family education;Stair training;Balance training;Functional mobility training    PT Goals (Current goals can be found in the Care Plan section)  Acute Rehab PT Goals Patient Stated Goal: return home PT Goal Formulation: With patient/family Time For Goal Achievement: 01/18/20 Potential to Achieve Goals: Good    Frequency Min 5X/week   Barriers to discharge        Co-evaluation                AM-PAC PT "6 Clicks" Mobility  Outcome Measure Help needed turning from your back to your side while in a flat bed without using bedrails?: None Help needed moving from lying on your back to sitting on the side of a flat bed without using bedrails?: None Help needed moving to and from a bed to a chair (including a wheelchair)?: None Help needed standing up from a chair using your arms (e.g., wheelchair or bedside chair)?: None Help needed to walk in hospital room?: None Help needed climbing 3-5 steps with a railing? : A Little 6 Click Score: 23    End of Session Equipment Utilized During Treatment: Gait belt Activity Tolerance: Patient tolerated treatment well Patient left: in bed;with call bell/phone within reach;with family/visitor present Nurse Communication: Mobility status PT Visit Diagnosis: Other abnormalities of gait and mobility (R26.89);Muscle weakness (generalized) (M62.81)    Time: 0932-3557 PT Time Calculation (min) (ACUTE ONLY): 30 min   Charges:   PT Evaluation $PT Eval Moderate Complexity: 1 Mod PT Treatments $Therapeutic Activity: 8-22 mins        Maggie Font, PT Acute Rehab Services Pager [phone] [facility] Re[facility] [phone] [facility] [phone]   Karlton Lemon 01/04/2020, 2:43 PM

## 2020-01-04 NOTE — Discharge Summary (Addendum)
Physician Discharge Summary  Patient ID: Tyrone Nelson MRN: [id number] DOB/AGE: 1971-03-31 49 y.o.  Admit date: 01/04/2020 Discharge date: 01/04/2020  Admission Diagnoses: L4-5 spinal stenosis, facet arthropathy, lumbago, lumbar radiculopathy, neurogenic claudication   Discharge Diagnoses: same   Discharged Condition: good  Hospital Course: The patient was admitted on 01/04/2020 and taken to the operating room where the patient underwent lumbar laminectomy and foraminotomy L4-5 bilateral. The patient tolerated the procedure well and was taken to the recovery room and then to the floor in stable condition. The hospital course was routine. There were no complications. The wound remained clean dry and intact. Pt had appropriate back soreness. No complaints of leg pain or new N/T/W. The patient remained afebrile with stable vital signs, and tolerated a regular diet. The patient continued to increase activities, and pain was well controlled with oral pain medications.   Consults: None  Significant Diagnostic Studies:  Results for orders placed or performed during the hospital encounter of 01/04/20  Glucose, capillary  Result Value Ref Range   Glucose-Capillary 198 (H) 70 - 99 mg/dL    DG Lumbar Spine 2-3 Views  Result Date: 01/04/2020 CLINICAL DATA:  Pain EXAM: LUMBAR SPINE - 2-3 VIEW COMPARISON:  None. FINDINGS: Two cross-table lateral x-rays of the lumbar spine. Posterior metallic instrument with the tip projecting over the L4 spinous process. Posterior metallic probe with the tip projecting over the inferior articulating facet of L4. Diffuse lumbar spine spondylosis. IMPRESSION: Intraoperative localization. Electronically Signed   By: Elige Ko   On: 01/04/2020 09:57    Antibiotics:  Anti-infectives (From admission, onward)   Start     Dose/Rate Route Frequency Ordered Stop   01/04/20 1400  ceFAZolin (ANCEF) IVPB 2g/100 mL premix     2 g 200 mL/hr over 30 Minutes Intravenous Every  8 hours 01/04/20 1100 01/05/20 0559   01/04/20 0816  bacitracin 50,000 Units in sodium chloride 0.9 % 500 mL irrigation  Status:  Discontinued       As needed 01/04/20 0817 01/04/20 0940   01/04/20 0645  ceFAZolin (ANCEF) IVPB 2g/100 mL premix     2 g 200 mL/hr over 30 Minutes Intravenous On call to O.R. 01/04/20 0630 01/04/20 0745   01/04/20 7322  ceFAZolin (ANCEF) 2-4 GM/100ML-% IVPB    Note to Pharmacy: Pauletta Browns   : cabinet override      01/04/20 0635 01/04/20 0743      Discharge Exam: Blood pressure 129/88, pulse 63, temperature 98.3 F (36.8 C), temperature source Oral, resp. rate 18, height 6\' 4"  (1.93 m), weight 102.1 kg, SpO2 100 %. Neurologic: Grossly normal Ambulating and voiding well   Discharge Medications:   Allergies as of 01/04/2020   No Known Allergies     Medication List    TAKE these medications   cyclobenzaprine 5 MG tablet Commonly known as: FLEXERIL Take 1 tablet (5 mg total) by mouth 3 (three) times daily as needed for muscle spasms.   diltiazem 240 MG 24 hr capsule Commonly known as: CARDIZEM CD Take 1 capsule (240 mg total) by mouth daily. For blood pressure (Needs to be seen before next refill)   oxyCODONE-acetaminophen 5-325 MG tablet Commonly known as: Percocet Take 1 tablet by mouth every 6 (six) hours as needed for severe pain.   pregabalin 300 MG capsule Commonly known as: Lyrica Take 1 capsule (300 mg total) by mouth daily.   trazodone 300 MG tablet Commonly known as: DESYREL Take 1 tablet (300 mg total) by mouth  at bedtime as needed for sleep.       Disposition: home    Final Dx: lumbar laminectomy and foraminotomy L4-5 bilateral  Discharge Instructions     Remove dressing in 72 hours   Complete by: As directed    Call MD for:   Complete by: As directed    Call MD for:  difficulty breathing, headache or visual disturbances   Complete by: As directed    Call MD for:  hives   Complete by: As directed    Call MD for:   persistant nausea and vomiting   Complete by: As directed    Call MD for:  redness, tenderness, or signs of infection (pain, swelling, redness, odor or green/yellow discharge around incision site)   Complete by: As directed    Call MD for:  severe uncontrolled pain   Complete by: As directed    Call MD for:  temperature >100.4   Complete by: As directed    Diet - low sodium heart healthy   Complete by: As directed    Driving Restrictions   Complete by: As directed    No driving for 2 weeks, no riding in the car for 1 week   Increase activity slowly   Complete by: As directed    Lifting restrictions   Complete by: As directed    No lifting more than 8 lbs         Signed: Ocie Cornfield Glenden Rossell 01/04/2020, 7:11 PM

## 2020-01-05 ENCOUNTER — Encounter: Payer: Self-pay | Admitting: *Deleted

## 2020-01-05 NOTE — Anesthesia Postprocedure Evaluation (Signed)
Anesthesia Post Note  Patient: Aneta Mins Michelotti  Procedure(s) Performed: Lumbar Four-Five Laminectomy/Foraminotomy (N/A Spine Lumbar)     Patient location during evaluation: PACU Anesthesia Type: General Level of consciousness: awake and alert Pain management: pain level controlled Vital Signs Assessment: post-procedure vital signs reviewed and stable Respiratory status: spontaneous breathing, nonlabored ventilation, respiratory function stable and patient connected to nasal cannula oxygen Cardiovascular status: blood pressure returned to baseline and stable Postop Assessment: no apparent nausea or vomiting Anesthetic complications: no    Last Vitals:  Vitals:   01/04/20 1110 01/04/20 1637  BP: (!) 135/97 129/88  Pulse: 68 63  Resp: 18 18  Temp: 36.4 C 36.8 C  SpO2: 97% 100%    Last Pain:  Vitals:   01/04/20 1928  TempSrc:   PainSc: 3                  Mariah Gerstenberger S

## 2020-02-04 ENCOUNTER — Other Ambulatory Visit: Payer: Self-pay | Admitting: Family Medicine

## 2020-02-15 ENCOUNTER — Other Ambulatory Visit: Payer: Self-pay | Admitting: Family Medicine

## 2020-02-15 MED ORDER — DILTIAZEM HCL ER COATED BEADS 240 MG PO CP24: 240.0000 mg | ORAL_CAPSULE | Freq: Every day | ORAL | 0 refills | Status: DC

## 2020-02-15 NOTE — Telephone Encounter (Signed)
Appt made for 02/24/20 30 days sent to [facility]

## 2020-02-23 ENCOUNTER — Telehealth: Payer: Self-pay | Admitting: Family Medicine

## 2020-02-23 NOTE — Telephone Encounter (Signed)
Pt called and cancelled his appt to see Dr Darlyn Read on 02/24/20 for med refill and BP Ck since refills were sent to pharmacy for him. Pt said that's the only reason he made the appt, was so that his medicines could be refilled and now that he has his refill he doesn't need to be seen.

## 2020-02-24 ENCOUNTER — Ambulatory Visit: Payer: Medicaid Other | Admitting: Family Medicine

## 2020-05-10 DIAGNOSIS — M5416 Radiculopathy, lumbar region: Secondary | ICD-10-CM | POA: Diagnosis not present

## 2020-05-10 DIAGNOSIS — Z9889 Other specified postprocedural states: Secondary | ICD-10-CM | POA: Diagnosis not present

## 2020-12-07 DIAGNOSIS — Z23 Encounter for immunization: Secondary | ICD-10-CM | POA: Diagnosis not present

## 2021-01-04 DIAGNOSIS — M5116 Intervertebral disc disorders with radiculopathy, lumbar region: Secondary | ICD-10-CM | POA: Diagnosis not present

## 2021-01-04 DIAGNOSIS — M48061 Spinal stenosis, lumbar region without neurogenic claudication: Secondary | ICD-10-CM | POA: Diagnosis not present

## 2021-01-04 DIAGNOSIS — Z9889 Other specified postprocedural states: Secondary | ICD-10-CM | POA: Diagnosis not present

## 2021-08-30 ENCOUNTER — Ambulatory Visit: Payer: Medicaid Other | Admitting: Family Medicine

## 2021-08-31 ENCOUNTER — Other Ambulatory Visit: Payer: Self-pay

## 2021-08-31 ENCOUNTER — Encounter: Payer: Self-pay | Admitting: Family Medicine

## 2021-08-31 ENCOUNTER — Ambulatory Visit: Payer: Medicaid Other | Admitting: Family Medicine

## 2021-08-31 VITALS — BP 132/74 | HR 80 | Temp 98.1°F | Ht 76.0 in | Wt 207.0 lb

## 2021-08-31 DIAGNOSIS — M67431 Ganglion, right wrist: Secondary | ICD-10-CM | POA: Diagnosis not present

## 2021-08-31 MED ORDER — PREDNISONE 20 MG PO TABS: 40.0000 mg | ORAL_TABLET | Freq: Every day | ORAL | 0 refills | Status: AC

## 2021-08-31 NOTE — Progress Notes (Signed)
Subjective:  Patient ID: Tyrone Nelson, male    DOB: Aug 13, 1971, 50 y.o.   MRN: [id number]  Patient Care Team: Mechele Claude, MD as PCP - General (Family Medicine) Wyline Mood Dorothe Pea, MD as PCP - Cardiology (Cardiology)   Chief Complaint:  Cyst (Right hand, been present for years. Recently causing more numbness)   HPI: Tyrone Nelson is a 50 y.o. male presenting on 08/31/2021 for Cyst (Right hand, been present for years. Recently causing more numbness)   Pt presents today for evaluation of ongoing swelling/cyst to right wrist. States this has been present for years but has recently started causing numbness and tingling to his fingers. Denies loss of function. States area is tender to touch. No reported injury.    Relevant past medical, surgical, family, and social history reviewed and updated as indicated.  Allergies and medications reviewed and updated. Data reviewed: Chart in Epic.   Past Medical History:  Diagnosis Date   Anxiety    Arthritis    Depression    Hypertension    Spinal stenosis, lumbar region with neurogenic claudication     Past Surgical History:  Procedure Laterality Date   CERVICAL SPINE SURGERY  2017   LUMBAR LAMINECTOMY/DECOMPRESSION MICRODISCECTOMY N/A 01/04/2020   Procedure: Lumbar Four-Five Laminectomy/Foraminotomy;  Surgeon: Tressie Stalker, MD;  Location: [facility] OR;  Service: Neurosurgery;  Laterality: N/A;  posterior   SPINE SURGERY  2017   vertebrae putting pressure on spinal cord    Social History   Socioeconomic History   Marital status: Divorced    Spouse name: Not on file   Number of children: Not on file   Years of education: Not on file   Highest education level: Not on file  Occupational History   Not on file  Tobacco Use   Smoking status: Former    Packs/day: 0.50    Years: 10.00    Pack years: 5.00    Types: Cigarettes    Quit date: 05/08/2019    Years since quitting: 2.3   Smokeless tobacco: Never  Vaping Use   Vaping  Use: Some days   Start date: 04/18/2019   Substances: Nicotine  Substance and Sexual Activity   Alcohol use: Yes    Comment: occasional   Drug use: Not Currently    Types: Marijuana    Comment: last used November 2020   Sexual activity: Not Currently    Birth control/protection: Condom  Other Topics Concern   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     Social Determinants of Health   Financial Resource Strain: Not on file  Food Insecurity: Not on file  Transportation Needs: Not on file  Physical Activity: Not on file  Stress: Not on file  Social Connections: Not on file  Intimate Partner Violence: Not on file    Outpatient Encounter Medications as of 08/31/2021  Medication Sig   predniSONE (DELTASONE) 20 MG tablet Take 2 tablets (40 mg total) by mouth daily with breakfast for 5 days.   [DISCONTINUED] cyclobenzaprine (FLEXERIL) 5 MG tablet Take 1 tablet (5 mg total) by mouth 3 (three) times daily as needed for muscle spasms.   [DISCONTINUED] diltiazem (CARDIZEM CD) 240 MG 24 hr capsule Take 1 capsule (240 mg total) by mouth daily. For blood pressure   [DISCONTINUED] pregabalin (LYRICA) 300 MG capsule Take 1 capsule (300 mg total) by mouth daily. (Patient not taking: Reported on 11/26/2019)   [DISCONTINUED] traZODone (DESYREL) 300 MG  tablet Take 1 tablet (300 mg total) by mouth at bedtime as needed for sleep. (Patient not taking: Reported on 11/26/2019)   No facility-administered encounter medications on file as of 08/31/2021.    No Known Allergies  Review of Systems  Constitutional:  Negative for activity change, appetite change, chills, diaphoresis, fatigue, fever and unexpected weight change.  HENT: Negative.    Eyes: Negative.   Respiratory:  Negative for cough, chest tightness and shortness of breath.   Cardiovascular:  Negative for chest pain, palpitations and leg swelling.  Gastrointestinal:  Negative for blood in stool, constipation,  diarrhea, nausea and vomiting.  Endocrine: Negative.   Genitourinary:  Negative for dysuria, frequency and urgency.  Musculoskeletal:  Positive for arthralgias and joint swelling. Negative for back pain, gait problem, myalgias, neck pain and neck stiffness.  Skin: Negative.   Allergic/Immunologic: Negative.   Neurological:  Negative for dizziness, weakness and headaches.  Hematological: Negative.   Psychiatric/Behavioral:  Negative for confusion, hallucinations, sleep disturbance and suicidal ideas.   All other systems reviewed and are negative.      Objective:  BP 132/74   Pulse 80   Temp 98.1 F (36.7 C)   Ht 6\' 4"  (1.93 m)   Wt 207 lb (93.9 kg)   SpO2 97%   BMI 25.20 kg/m    Wt Readings from Last 3 Encounters:  08/31/21 207 lb (93.9 kg)  01/04/20 225 lb (102.1 kg)  01/01/20 226 lb 12.8 oz (102.9 kg)    Physical Exam Vitals and nursing note reviewed.  Constitutional:      General: He is not in acute distress.    Appearance: Normal appearance. He is well-developed, well-groomed and normal weight. He is not ill-appearing, toxic-appearing or diaphoretic.  HENT:     Head: Normocephalic and atraumatic.     Jaw: There is normal jaw occlusion.     Right Ear: Hearing normal.     Left Ear: Hearing normal.     Nose: Nose normal.     Mouth/Throat:     Lips: Pink.     Mouth: Mucous membranes are moist.     Pharynx: Oropharynx is clear. Uvula midline.  Eyes:     General: Lids are normal.     Conjunctiva/sclera: Conjunctivae normal.     Pupils: Pupils are equal, round, and reactive to light.  Neck:     Thyroid: No thyroid mass, thyromegaly or thyroid tenderness.     Vascular: No carotid bruit or JVD.     Trachea: Trachea and phonation normal.  Cardiovascular:     Rate and Rhythm: Normal rate and regular rhythm.     Chest Wall: PMI is not displaced.     Pulses: Normal pulses.     Heart sounds: Normal heart sounds. No murmur heard.   No friction rub. No gallop.   Pulmonary:     Effort: Pulmonary effort is normal. No respiratory distress.     Breath sounds: Normal breath sounds. No wheezing.  Abdominal:     General: There is no abdominal bruit.     Palpations: There is no hepatomegaly or splenomegaly.  Musculoskeletal:        General: Normal range of motion.     Right forearm: Normal.     Right wrist: Swelling present. No deformity, effusion, lacerations, tenderness, bony tenderness, snuff box tenderness or crepitus. Normal range of motion. Normal pulse.     Left wrist: Normal.     Right hand: Normal.  Arms:     Cervical back: Normal range of motion and neck supple.     Right lower leg: No edema.     Left lower leg: No edema.  Lymphadenopathy:     Cervical: No cervical adenopathy.  Skin:    General: Skin is warm and dry.     Capillary Refill: Capillary refill takes less than 2 seconds.     Coloration: Skin is not cyanotic, jaundiced or pale.     Findings: No rash.  Neurological:     General: No focal deficit present.     Mental Status: He is alert and oriented to person, place, and time.     Sensory: Sensation is intact.     Motor: Motor function is intact.     Coordination: Coordination is intact.     Gait: Gait is intact.     Deep Tendon Reflexes: Reflexes are normal and symmetric.  Psychiatric:        Attention and Perception: Attention and perception normal.        Mood and Affect: Mood and affect normal.        Speech: Speech normal.        Behavior: Behavior normal. Behavior is cooperative.        Thought Content: Thought content normal.        Cognition and Memory: Cognition and memory normal.        Judgment: Judgment normal.    Results for orders placed or performed during the hospital encounter of 01/04/20  Glucose, capillary  Result Value Ref Range   Glucose-Capillary 198 (H) 70 - 99 mg/dL       Pertinent labs & imaging results that were available during my care of the patient were reviewed by me and  considered in my medical decision making.  Assessment & Plan:  Tyrone Nelson was seen today for cyst.  Diagnoses and all orders for this visit:  Ganglion cyst of dorsum of right wrist Classic ganglion cyst presentation. Offered needle aspiration in office for symptomatic relief, pt declined. Referral to orthopedic surgery placed. Will burst with steroids for antiinflammatory properties. Offered wrist brace, pt declined. Symptomatic care discussed in detail. Report any new, worsening, or persistent symptoms.  -     Ambulatory referral to Orthopedic Surgery -     predniSONE (DELTASONE) 20 MG tablet; Take 2 tablets (40 mg total) by mouth daily with breakfast for 5 days.    Continue all other maintenance medications.  Follow up plan: Return if symptoms worsen or fail to improve.   Continue healthy lifestyle choices, including diet (rich in fruits, vegetables, and lean proteins, and low in salt and simple carbohydrates) and exercise (at least 30 minutes of moderate physical activity daily).  Educational handout given for ganglion cyst.   The above assessment and management plan was discussed with the patient. The patient verbalized understanding of and has agreed to the management plan. Patient is aware to call the clinic if they develop any new symptoms or if symptoms persist or worsen. Patient is aware when to return to the clinic for a follow-up visit. Patient educated on when it is appropriate to go to the emergency department.   Kari Baars, FNP-C [facility] [phone]

## 2021-09-07 ENCOUNTER — Other Ambulatory Visit: Payer: Self-pay

## 2021-09-07 ENCOUNTER — Ambulatory Visit (INDEPENDENT_AMBULATORY_CARE_PROVIDER_SITE_OTHER): Payer: Medicaid Other | Admitting: Orthopaedic Surgery

## 2021-09-07 ENCOUNTER — Ambulatory Visit: Payer: Self-pay

## 2021-09-07 DIAGNOSIS — M67431 Ganglion, right wrist: Secondary | ICD-10-CM

## 2021-09-07 NOTE — Progress Notes (Signed)
Office Visit Note   Patient: Tyrone Nelson           Date of Birth: 02/11/1971           MRN: [id number] Visit Date: 09/07/2021              Requested by: Sonny Masters, FNP [address] [place],  [place] [address] PCP: Mechele Claude, MD   Assessment & Plan: Visit Diagnoses:  1. Ganglion of right wrist   2. 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  If he uses his hands out as well as some.  We will have him see Dr. Frazier Butt so we can discuss treatment options that might be available to him at some point.  Wrist x-rays can be seen on canopy listed under SOS but images cannot be found in epic.  Follow-Up Instructions: No follow-ups on file.   Orders:  Orders Placed This Encounter  Procedures   XR Wrist Complete Right   No orders of the defined types were placed in this encounter.     Procedures: No procedures performed   Clinical Data: No additional findings.   Subjective: Chief Complaint  Patient presents with   Right Wrist - Pain    HPI 50 year old male had cervical laminoplasty's for likely stenosis has not worked since that time and had problems with right wrist pain with ganglion over the snuffbox that is worse with activities.  It swells and is painful.  He denies associated neck pain with the wrist ganglion.  No past history of a significant trauma or fracture to the wrist.  He has noted stiffness and decreased range of motion of the wrist.  Review of Systems previous cervical laminoplasty C3-T1 2017 at [facility].  Laminectomy and foraminotomies L4-5 Dr. Lovell Sheehan 2021.   Objective: Vital Signs: There were no vitals taken for this visit.  Physical Exam Constitutional:      Appearance: He is well-developed.  HENT:     Head: Normocephalic and atraumatic.     Right Ear: External ear normal.     Left Ear: External ear normal.  Eyes:     Pupils: Pupils are equal,  round, and reactive to light.  Neck:     Thyroid: No thyromegaly.     Trachea: No tracheal deviation.  Cardiovascular:     Rate and Rhythm: Normal rate.  Pulmonary:     Effort: Pulmonary effort is normal.     Breath sounds: No wheezing.  Abdominal:     General: Bowel sounds are normal.     Palpations: Abdomen is soft.  Musculoskeletal:     Cervical back: Neck supple.  Skin:    General: Skin is warm and dry.     Capillary Refill: Capillary refill takes less than 2 seconds.  Neurological:     Mental Status: He is alert and oriented to person, place, and time.  Psychiatric:        Behavior: Behavior normal.        Thought Content: Thought content normal.        Judgment: Judgment normal.    Ortho Exam  Specialty Comments:  No specialty comments available.  Imaging: No results found.   PMFS History: Patient Active Problem List   Diagnosis Date Noted   Ganglion cyst of wrist, right 09/07/2021   Spinal stenosis of lumbar region with neurogenic claudication 01/04/2020   Cervical myelopathy (HCC) 08/24/2019  Gait abnormality 08/24/2019   Spinal stenosis of lumbar region 08/24/2019   Neuropathy 07/09/2019   Numbness 07/09/2019   Flank pain 07/09/2019   Alcohol abuse 04/23/2019   Palpitations 02/11/2019   Past Medical History:  Diagnosis Date   Anxiety    Arthritis    Depression    Hypertension    Spinal stenosis, lumbar region with neurogenic claudication     Family History  Problem Relation Age of Onset   Hypertension Mother    Cancer Brother     Past Surgical History:  Procedure Laterality Date   CERVICAL SPINE SURGERY  2017   LUMBAR LAMINECTOMY/DECOMPRESSION MICRODISCECTOMY N/A 01/04/2020   Procedure: Lumbar Four-Five Laminectomy/Foraminotomy;  Surgeon: Tressie Stalker, MD;  Location: [facility] OR;  Service: Neurosurgery;  Laterality: N/A;  posterior   SPINE SURGERY  2017   vertebrae putting pressure on spinal cord   Social History   Occupational History    Not on file  Tobacco Use   Smoking status: Former    Packs/day: 0.50    Years: 10.00    Pack years: 5.00    Types: Cigarettes    Quit date: 05/08/2019    Years since quitting: 2.3   Smokeless tobacco: Never  Vaping Use   Vaping Use: Some days   Start date: 04/18/2019   Substances: Nicotine  Substance and Sexual Activity   Alcohol use: Yes    Comment: occasional   Drug use: Not Currently    Types: Marijuana    Comment: last used November 2020   Sexual activity: Not Currently    Birth control/protection: Condom

## 2021-09-12 ENCOUNTER — Ambulatory Visit: Payer: Medicaid Other | Admitting: Orthopedic Surgery

## 2021-09-14 ENCOUNTER — Ambulatory Visit: Payer: Medicaid Other | Admitting: Family Medicine

## 2021-09-14 ENCOUNTER — Encounter: Payer: Self-pay | Admitting: Family Medicine

## 2021-09-14 ENCOUNTER — Other Ambulatory Visit: Payer: Self-pay

## 2021-09-14 ENCOUNTER — Ambulatory Visit (INDEPENDENT_AMBULATORY_CARE_PROVIDER_SITE_OTHER): Payer: Medicaid Other

## 2021-09-14 VITALS — BP 156/103 | HR 73 | Temp 97.8°F | Ht 76.0 in | Wt 209.6 lb

## 2021-09-14 DIAGNOSIS — R109 Unspecified abdominal pain: Secondary | ICD-10-CM | POA: Diagnosis not present

## 2021-09-14 DIAGNOSIS — Z8679 Personal history of other diseases of the circulatory system: Secondary | ICD-10-CM

## 2021-09-14 LAB — MICROSCOPIC EXAMINATION
Bacteria, UA: NONE SEEN
RBC, Urine: NONE SEEN /hpf (ref 0–2)
Renal Epithel, UA: NONE SEEN /hpf
WBC, UA: NONE SEEN /hpf (ref 0–5)

## 2021-09-14 LAB — URINALYSIS, ROUTINE W REFLEX MICROSCOPIC
Bilirubin, UA: NEGATIVE
Glucose, UA: NEGATIVE
Ketones, UA: NEGATIVE
Leukocytes,UA: NEGATIVE
Nitrite, UA: NEGATIVE
Protein,UA: NEGATIVE
Specific Gravity, UA: 1.02 (ref 1.005–1.030)
Urobilinogen, Ur: 0.2 mg/dL (ref 0.2–1.0)
pH, UA: 5.5 (ref 5.0–7.5)

## 2021-09-14 MED ORDER — TAMSULOSIN HCL 0.4 MG PO CAPS: 0.4000 mg | ORAL_CAPSULE | Freq: Every day | ORAL | 0 refills | Status: AC

## 2021-09-14 NOTE — Progress Notes (Signed)
Assessment & Plan:  1. Right flank pain UA not concerning for UTI. He does have trace blood on the UA and his KUB showed an overlying gas and stool pattern that significantly limited assessment for urinary tract calculi. I am treating with Flomax for possible kidney stone. Encouraged patient to start taking NSAID routinely for pain control. May also use heating pad, Lidoderm patches, and muscle rub to help with pain control. Education provided on kidney stones. - Urinalysis, Routine w reflex microscopic - Urine dipstick shows positive for RBC's.  Micro exam: negative for WBC's or RBC's. - DG Abd 1 View - Microscopic Examination - tamsulosin (FLOMAX) 0.4 MG CAPS capsule; Take 1 capsule (0.4 mg total) by mouth daily.  Dispense: 14 capsule; Refill: 0  2. History of hypertension Patient was on medication in the past. BP is elevated today. Encouraged him to monitor at home, keep a log, and bring it back to see his PCP. Discussed BP may be elevated today due to pain.   Deliah Boston, MSN, APRN, FNP-C [facility]  Subjective:   Patient ID: Tyrone Nelson, male    DOB: 05/04/1971, 50 y.o.   MRN: [id number]  HPI: Tyrone Nelson is a 50 y.o. male presenting on 09/14/2021 for Flank Pain (Right x 1 week and got worse x 3 days)  Patient reports right flank pain x1 week that has gotten worse over the past three days. It radiates down towards his right hip. The pain is not constant; it is most noticeable with movement. Denies any unusual activities recently, falls, or blood in his urine. No history of kidney stones. He has not taken anything for the pain.   ROS: Negative unless specifically indicated above in HPI.   Relevant past medical history reviewed and updated as indicated.   Allergies and medications reviewed and updated.  No current outpatient medications on file.  No Known Allergies  Objective:   BP (!) 156/103   Pulse 73   Temp 97.8 F (36.6 C) (Temporal)    Ht 6\' 4"  (1.93 m)   Wt 209 lb 9.6 oz (95.1 kg)   SpO2 98%   BMI 25.51 kg/m    Physical Exam Vitals reviewed.  Constitutional:      General: He is not in acute distress.    Appearance: Normal appearance. He is not ill-appearing, toxic-appearing or diaphoretic.  HENT:     Head: Normocephalic and atraumatic.  Eyes:     General: No scleral icterus.       Right eye: No discharge.        Left eye: No discharge.     Conjunctiva/sclera: Conjunctivae normal.  Cardiovascular:     Rate and Rhythm: Normal rate.  Pulmonary:     Effort: Pulmonary effort is normal. No respiratory distress.  Abdominal:     Tenderness: There is no right CVA tenderness or left CVA tenderness.  Musculoskeletal:        General: Normal range of motion.     Cervical back: Normal range of motion.     Right hip: Normal.  Skin:    General: Skin is warm and dry.  Neurological:     Mental Status: He is alert and oriented to person, place, and time. Mental status is at baseline.  Psychiatric:        Mood and Affect: Mood normal.        Behavior: Behavior normal.        Thought Content: Thought content normal.  Judgment: Judgment normal.

## 2022-06-02 DIAGNOSIS — Z0289 Encounter for other administrative examinations: Secondary | ICD-10-CM

## 2022-12-28 IMAGING — DX DG ABDOMEN 1V
2 series · 2 of 2 positions shown · non-contrast
Comparison: None.

CLINICAL DATA: Right flank pain

EXAM:
ABDOMEN - 1 VIEW

[abdomen kub (1 of 2)]
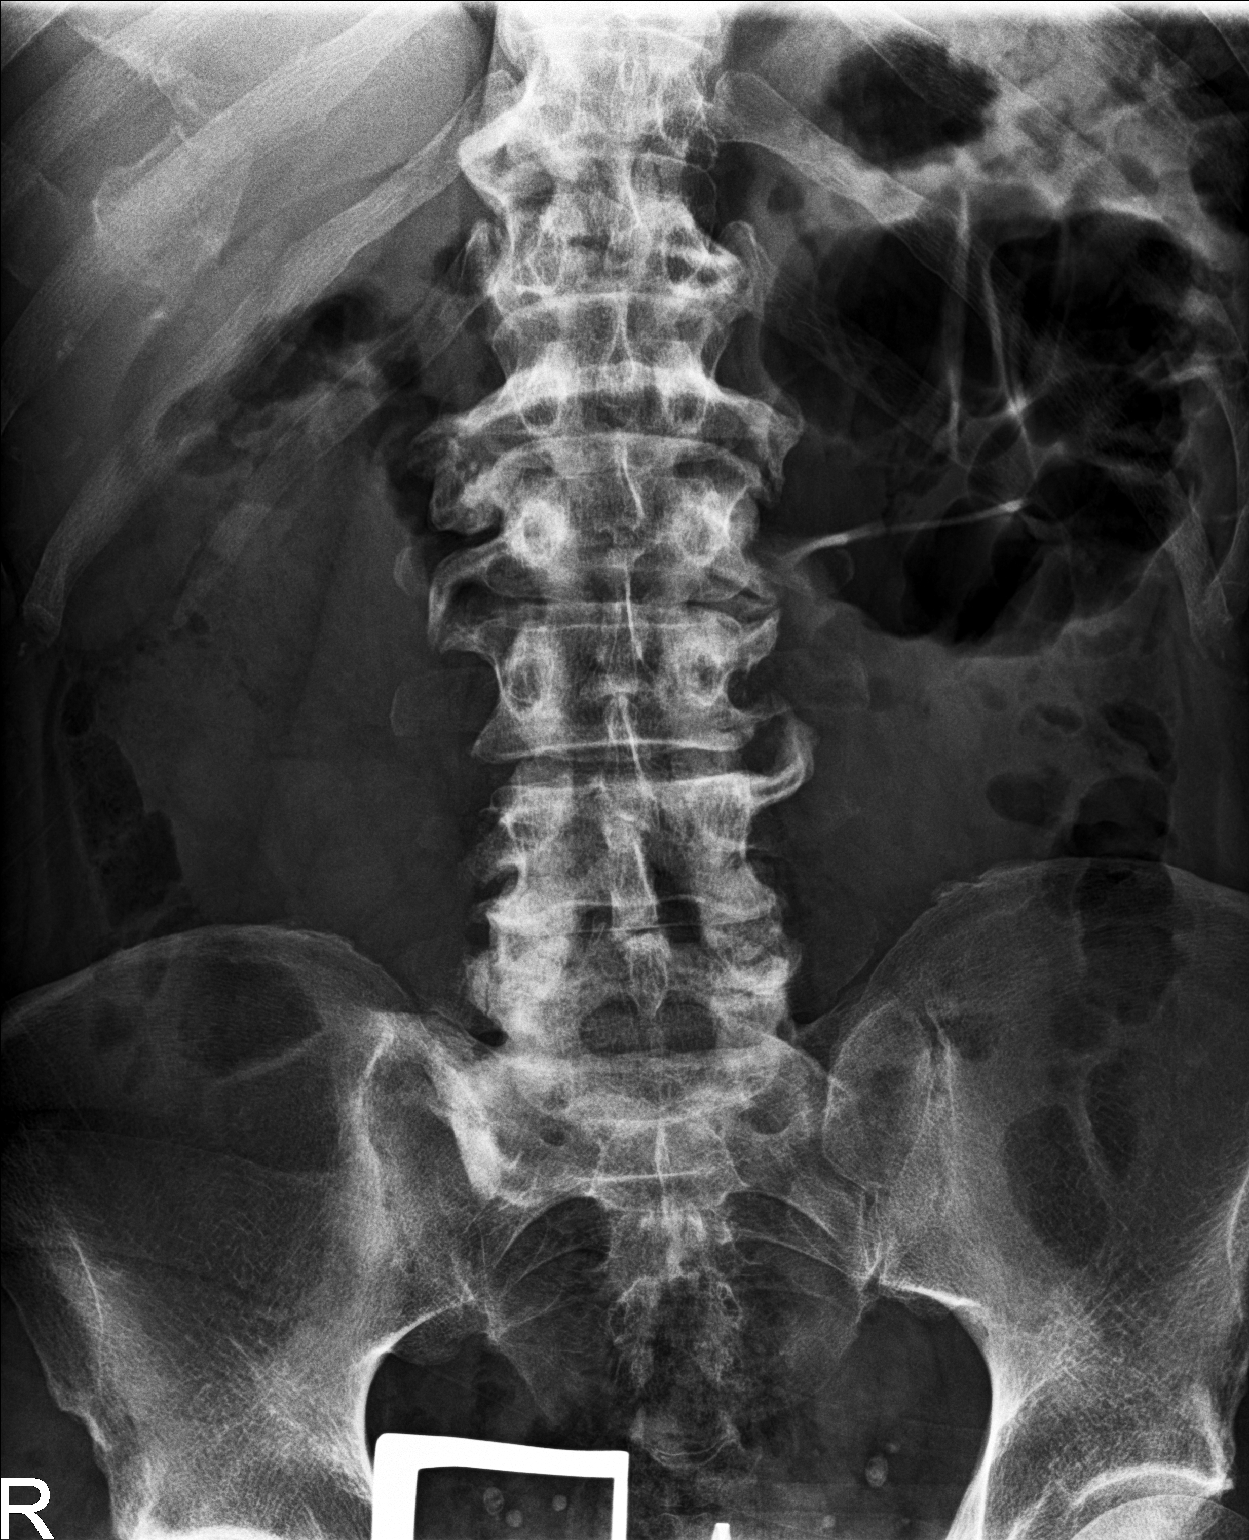

[abdomen kub (2 of 2)]
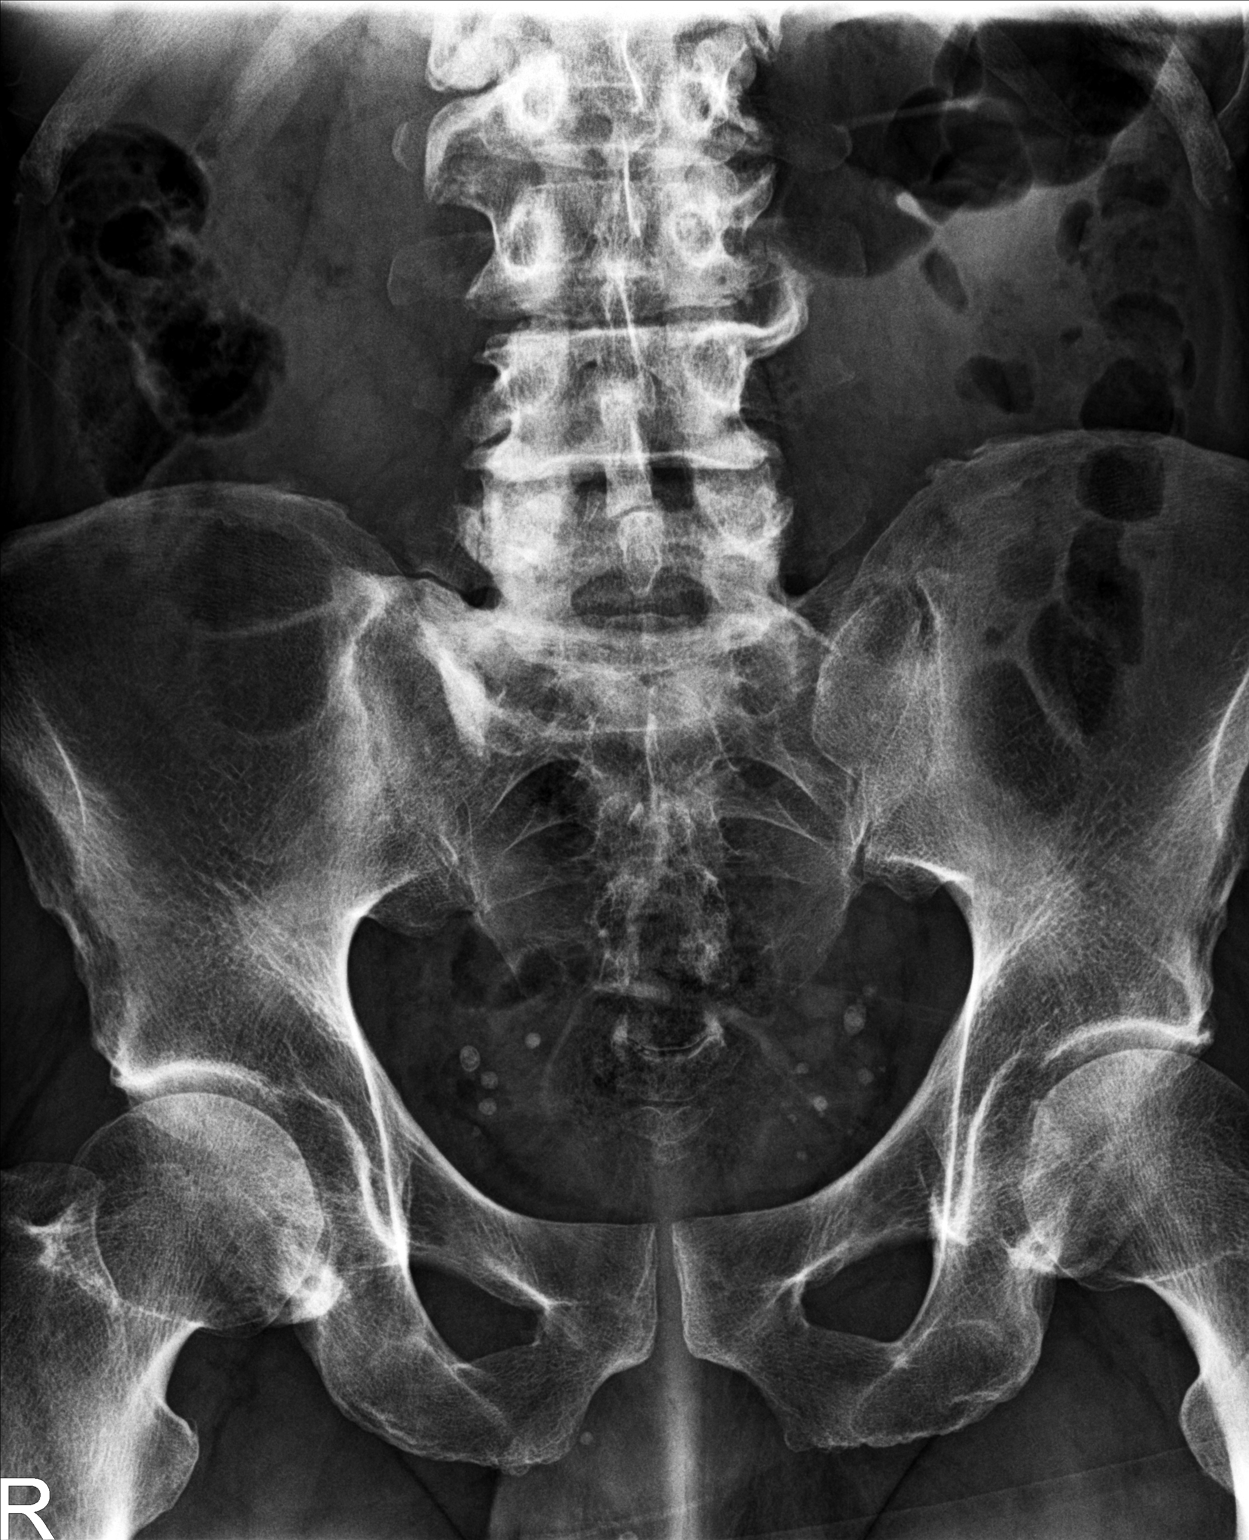

[2 of 2 positions shown; findings below may reference images not displayed]

FINDINGS: The bowel gas pattern is normal. Overlying gas and stool
significantly limits assessment for urinary tract calculi. Within
this limitation, no radio-opaque calculi or other significant
radiographic abnormality are seen. Multiple rounded phleboliths in
the pelvis.
IMPRESSION: 1. Overlying gas and stool significantly limits assessment for
urinary tract calculi. Within this limitation, no radio-opaque
calculi are seen. Multiple rounded phleboliths in the pelvis.

2.  Nonobstructive pattern of bowel gas.
